# Patient Record
Sex: Female | Born: 1955 | Race: Black or African American | Hispanic: No | Marital: Married | Smoking: Never smoker
Health system: Southern US, Community
[De-identification: ages and names within clinical notes are randomized; demographics above are authoritative.]

## PROBLEM LIST (undated history)

## (undated) DIAGNOSIS — T7840XA Allergy, unspecified, initial encounter: Secondary | ICD-10-CM

## (undated) DIAGNOSIS — M1990 Unspecified osteoarthritis, unspecified site: Secondary | ICD-10-CM

## (undated) DIAGNOSIS — R519 Headache, unspecified: Secondary | ICD-10-CM

## (undated) DIAGNOSIS — D649 Anemia, unspecified: Secondary | ICD-10-CM

## (undated) DIAGNOSIS — H269 Unspecified cataract: Secondary | ICD-10-CM

## (undated) DIAGNOSIS — J45909 Unspecified asthma, uncomplicated: Secondary | ICD-10-CM

## (undated) DIAGNOSIS — R51 Headache: Secondary | ICD-10-CM

## (undated) DIAGNOSIS — Z8669 Personal history of other diseases of the nervous system and sense organs: Secondary | ICD-10-CM

## (undated) DIAGNOSIS — I10 Essential (primary) hypertension: Secondary | ICD-10-CM

## (undated) HISTORY — DX: Unspecified cataract: H26.9

## (undated) HISTORY — PX: BILATERAL CARPAL TUNNEL RELEASE: SHX6508

## (undated) HISTORY — PX: DENTAL SURGERY: SHX609

## (undated) HISTORY — DX: Personal history of other diseases of the nervous system and sense organs: Z86.69

## (undated) HISTORY — DX: Essential (primary) hypertension: I10

## (undated) HISTORY — PX: OTHER SURGICAL HISTORY: SHX169

## (undated) HISTORY — PX: COLONOSCOPY: SHX174

## (undated) HISTORY — PX: TUBAL LIGATION: SHX77

## (undated) HISTORY — DX: Allergy, unspecified, initial encounter: T78.40XA

---

## 1997-09-08 ENCOUNTER — Ambulatory Visit (HOSPITAL_COMMUNITY): Admission: RE | Admit: 1997-09-08 | Discharge: 1997-09-08 | Payer: Self-pay | Admitting: Gynecology

## 2001-09-25 ENCOUNTER — Encounter: Admission: RE | Admit: 2001-09-25 | Discharge: 2001-09-25 | Payer: Self-pay | Admitting: Family Medicine

## 2001-09-25 ENCOUNTER — Encounter: Payer: Self-pay | Admitting: Family Medicine

## 2002-01-28 ENCOUNTER — Other Ambulatory Visit: Admission: RE | Admit: 2002-01-28 | Discharge: 2002-01-28 | Payer: Self-pay | Admitting: Family Medicine

## 2002-12-16 ENCOUNTER — Other Ambulatory Visit: Admission: RE | Admit: 2002-12-16 | Discharge: 2002-12-16 | Payer: Self-pay | Admitting: Family Medicine

## 2003-01-14 ENCOUNTER — Encounter: Payer: Self-pay | Admitting: Family Medicine

## 2003-01-14 ENCOUNTER — Encounter: Admission: RE | Admit: 2003-01-14 | Discharge: 2003-01-14 | Payer: Self-pay | Admitting: Family Medicine

## 2004-01-09 ENCOUNTER — Other Ambulatory Visit: Admission: RE | Admit: 2004-01-09 | Discharge: 2004-01-09 | Payer: Self-pay | Admitting: Family Medicine

## 2004-01-27 ENCOUNTER — Encounter: Admission: RE | Admit: 2004-01-27 | Discharge: 2004-01-27 | Payer: Self-pay | Admitting: Family Medicine

## 2004-02-14 ENCOUNTER — Ambulatory Visit: Payer: Self-pay | Admitting: Family Medicine

## 2004-03-06 ENCOUNTER — Ambulatory Visit: Payer: Self-pay | Admitting: Family Medicine

## 2004-06-29 ENCOUNTER — Ambulatory Visit: Payer: Self-pay | Admitting: Family Medicine

## 2005-01-04 ENCOUNTER — Ambulatory Visit: Payer: Self-pay | Admitting: Family Medicine

## 2005-02-07 ENCOUNTER — Other Ambulatory Visit: Admission: RE | Admit: 2005-02-07 | Discharge: 2005-02-07 | Payer: Self-pay | Admitting: Family Medicine

## 2005-02-07 ENCOUNTER — Ambulatory Visit: Payer: Self-pay | Admitting: Family Medicine

## 2005-02-14 ENCOUNTER — Encounter: Admission: RE | Admit: 2005-02-14 | Discharge: 2005-02-14 | Payer: Self-pay | Admitting: Family Medicine

## 2005-04-05 ENCOUNTER — Ambulatory Visit: Payer: Self-pay | Admitting: Family Medicine

## 2006-02-03 ENCOUNTER — Ambulatory Visit: Payer: Self-pay | Admitting: Family Medicine

## 2006-02-03 LAB — CONVERTED CEMR LAB
ALT: 59 units/L — ABNORMAL HIGH (ref 0–40)
AST: 42 units/L — ABNORMAL HIGH (ref 0–37)
Albumin: 3.6 g/dL (ref 3.5–5.2)
Alkaline Phosphatase: 48 units/L (ref 39–117)
BUN: 18 mg/dL (ref 6–23)
Basophils Absolute: 0 10*3/uL (ref 0.0–0.1)
Basophils Relative: 0.8 % (ref 0.0–1.0)
Bilirubin Urine: NEGATIVE
CO2: 29 meq/L (ref 19–32)
Calcium: 9.1 mg/dL (ref 8.4–10.5)
Chloride: 108 meq/L (ref 96–112)
Chol/HDL Ratio, serum: 3.4
Cholesterol: 172 mg/dL (ref 0–200)
Creatinine, Ser: 0.8 mg/dL (ref 0.4–1.2)
Eosinophil percent: 3.8 % (ref 0.0–5.0)
GFR calc non Af Amer: 81 mL/min
Glomerular Filtration Rate, Af Am: 98 mL/min/{1.73_m2}
Glucose, Bld: 91 mg/dL (ref 70–99)
HCT: 35.2 % — ABNORMAL LOW (ref 36.0–46.0)
HDL: 50.4 mg/dL (ref >39.0)
Hemoglobin: 11.7 g/dL — ABNORMAL LOW (ref 12.0–15.0)
Ketones, ur: NEGATIVE mg/dL
LDL Cholesterol: 109 mg/dL — ABNORMAL HIGH (ref 0–99)
Leukocytes, UA: NEGATIVE
Lymphocytes Relative: 44.7 % (ref 12.0–46.0)
MCHC: 33.4 g/dL (ref 30.0–36.0)
MCV: 85.5 fL (ref 78.0–100.0)
Monocytes Absolute: 0.2 10*3/uL (ref 0.2–0.7)
Monocytes Relative: 6.3 % (ref 3.0–11.0)
Neutro Abs: 1.7 10*3/uL (ref 1.4–7.7)
Neutrophils Relative %: 44.4 % (ref 43.0–77.0)
Nitrite: NEGATIVE
Platelets: 186 10*3/uL (ref 150–400)
Potassium: 3.6 meq/L (ref 3.5–5.1)
RBC: 4.11 M/uL (ref 3.87–5.11)
RDW: 12.8 % (ref 11.5–14.6)
Sodium: 144 meq/L (ref 135–145)
Specific Gravity, Urine: 1.025 (ref 1.000–1.03)
TSH: 3.94 microintl units/mL (ref 0.35–5.50)
Total Bilirubin: 0.5 mg/dL (ref 0.3–1.2)
Total Protein, Urine: NEGATIVE mg/dL
Total Protein: 6.7 g/dL (ref 6.0–8.3)
Triglyceride fasting, serum: 62 mg/dL (ref 0–149)
Urine Glucose: NEGATIVE mg/dL
Urobilinogen, UA: 0.2 (ref 0.0–1.0)
VLDL: 12 mg/dL (ref 0–40)
WBC: 3.8 10*3/uL — ABNORMAL LOW (ref 4.5–10.5)
pH: 6 (ref 5.0–8.0)

## 2006-02-10 ENCOUNTER — Other Ambulatory Visit: Admission: RE | Admit: 2006-02-10 | Discharge: 2006-02-10 | Payer: Self-pay | Admitting: Family Medicine

## 2006-02-10 ENCOUNTER — Encounter: Payer: Self-pay | Admitting: Family Medicine

## 2006-02-10 ENCOUNTER — Ambulatory Visit: Payer: Self-pay | Admitting: Family Medicine

## 2006-02-17 ENCOUNTER — Encounter: Admission: RE | Admit: 2006-02-17 | Discharge: 2006-02-17 | Payer: Self-pay | Admitting: Family Medicine

## 2006-07-17 ENCOUNTER — Ambulatory Visit: Payer: Self-pay | Admitting: Internal Medicine

## 2006-11-04 DIAGNOSIS — I10 Essential (primary) hypertension: Secondary | ICD-10-CM | POA: Insufficient documentation

## 2007-02-04 ENCOUNTER — Ambulatory Visit: Payer: Self-pay | Admitting: Family Medicine

## 2007-02-04 LAB — CONVERTED CEMR LAB
ALT: 18 units/L (ref 0–35)
AST: 22 units/L (ref 0–37)
Albumin: 3.8 g/dL (ref 3.5–5.2)
Alkaline Phosphatase: 69 units/L (ref 39–117)
BUN: 14 mg/dL (ref 6–23)
Basophils Absolute: 0 10*3/uL (ref 0.0–0.1)
Basophils Relative: 0.2 % (ref 0.0–1.0)
Bilirubin Urine: NEGATIVE
Bilirubin, Direct: 0.2 mg/dL (ref 0.0–0.3)
Blood in Urine, dipstick: NEGATIVE
CO2: 30 meq/L (ref 19–32)
Calcium: 9.8 mg/dL (ref 8.4–10.5)
Chloride: 105 meq/L (ref 96–112)
Cholesterol: 184 mg/dL (ref 0–200)
Creatinine, Ser: 0.9 mg/dL (ref 0.4–1.2)
Eosinophils Absolute: 0.1 10*3/uL (ref 0.0–0.6)
Eosinophils Relative: 2.3 % (ref 0.0–5.0)
GFR calc Af Amer: 85 mL/min
GFR calc non Af Amer: 70 mL/min
Glucose, Bld: 88 mg/dL (ref 70–99)
Glucose, Urine, Semiquant: NEGATIVE
HCT: 38.3 % (ref 36.0–46.0)
HDL: 61 mg/dL (ref >39.0)
Hemoglobin: 12.9 g/dL (ref 12.0–15.0)
Ketones, urine, test strip: NEGATIVE
LDL Cholesterol: 108 mg/dL — ABNORMAL HIGH (ref 0–99)
Lymphocytes Relative: 45.4 % (ref 12.0–46.0)
MCHC: 33.6 g/dL (ref 30.0–36.0)
MCV: 86.5 fL (ref 78.0–100.0)
Monocytes Absolute: 0.3 10*3/uL (ref 0.2–0.7)
Monocytes Relative: 8.1 % (ref 3.0–11.0)
Neutro Abs: 1.8 10*3/uL (ref 1.4–7.7)
Neutrophils Relative %: 44 % (ref 43.0–77.0)
Nitrite: NEGATIVE
Platelets: 201 10*3/uL (ref 150–400)
Potassium: 3.5 meq/L (ref 3.5–5.1)
RBC: 4.43 M/uL (ref 3.87–5.11)
RDW: 13.3 % (ref 11.5–14.6)
Sodium: 142 meq/L (ref 135–145)
Specific Gravity, Urine: 1.015
TSH: 4.42 microintl units/mL (ref 0.35–5.50)
Total Bilirubin: 0.8 mg/dL (ref 0.3–1.2)
Total CHOL/HDL Ratio: 3
Total Protein: 7 g/dL (ref 6.0–8.3)
Triglycerides: 76 mg/dL (ref 0–149)
Urobilinogen, UA: 0.2
VLDL: 15 mg/dL (ref 0–40)
WBC Urine, dipstick: NEGATIVE
WBC: 4.2 10*3/uL — ABNORMAL LOW (ref 4.5–10.5)
pH: 7

## 2007-02-11 ENCOUNTER — Other Ambulatory Visit: Admission: RE | Admit: 2007-02-11 | Discharge: 2007-02-11 | Payer: Self-pay | Admitting: Family Medicine

## 2007-02-11 ENCOUNTER — Encounter: Payer: Self-pay | Admitting: Family Medicine

## 2007-02-11 ENCOUNTER — Ambulatory Visit: Payer: Self-pay | Admitting: Family Medicine

## 2007-02-17 ENCOUNTER — Ambulatory Visit: Payer: Self-pay | Admitting: Family Medicine

## 2007-02-17 DIAGNOSIS — D492 Neoplasm of unspecified behavior of bone, soft tissue, and skin: Secondary | ICD-10-CM | POA: Insufficient documentation

## 2007-02-20 ENCOUNTER — Encounter: Admission: RE | Admit: 2007-02-20 | Discharge: 2007-02-20 | Payer: Self-pay | Admitting: Family Medicine

## 2007-02-24 ENCOUNTER — Telehealth: Payer: Self-pay | Admitting: Family Medicine

## 2007-03-14 ENCOUNTER — Telehealth: Payer: Self-pay | Admitting: Family Medicine

## 2007-03-16 ENCOUNTER — Telehealth: Payer: Self-pay | Admitting: Family Medicine

## 2007-04-14 ENCOUNTER — Ambulatory Visit: Payer: Self-pay | Admitting: Family Medicine

## 2007-04-29 ENCOUNTER — Telehealth: Payer: Self-pay | Admitting: Family Medicine

## 2007-06-12 ENCOUNTER — Telehealth: Payer: Self-pay | Admitting: Family Medicine

## 2007-11-06 ENCOUNTER — Ambulatory Visit (HOSPITAL_BASED_OUTPATIENT_CLINIC_OR_DEPARTMENT_OTHER): Admission: RE | Admit: 2007-11-06 | Discharge: 2007-11-06 | Payer: Self-pay | Admitting: Orthopedic Surgery

## 2008-02-02 ENCOUNTER — Ambulatory Visit (HOSPITAL_BASED_OUTPATIENT_CLINIC_OR_DEPARTMENT_OTHER): Admission: RE | Admit: 2008-02-02 | Discharge: 2008-02-02 | Payer: Self-pay | Admitting: Orthopedic Surgery

## 2008-02-11 ENCOUNTER — Ambulatory Visit: Payer: Self-pay | Admitting: Family Medicine

## 2008-02-11 LAB — CONVERTED CEMR LAB
ALT: 21 units/L (ref 0–35)
AST: 24 units/L (ref 0–37)
Albumin: 3.8 g/dL (ref 3.5–5.2)
Alkaline Phosphatase: 61 units/L (ref 39–117)
BUN: 23 mg/dL (ref 6–23)
Basophils Absolute: 0 10*3/uL (ref 0.0–0.1)
Basophils Relative: 0.2 % (ref 0.0–3.0)
Bilirubin Urine: NEGATIVE
Bilirubin, Direct: 0.1 mg/dL (ref 0.0–0.3)
CO2: 29 meq/L (ref 19–32)
Calcium: 9.2 mg/dL (ref 8.4–10.5)
Chloride: 103 meq/L (ref 96–112)
Cholesterol: 190 mg/dL (ref 0–200)
Creatinine, Ser: 1 mg/dL (ref 0.4–1.2)
Eosinophils Absolute: 0.1 10*3/uL (ref 0.0–0.7)
Eosinophils Relative: 2.6 % (ref 0.0–5.0)
GFR calc Af Amer: 75 mL/min
GFR calc non Af Amer: 62 mL/min
Glucose, Bld: 90 mg/dL (ref 70–99)
Glucose, Urine, Semiquant: NEGATIVE
HCT: 36.8 % (ref 36.0–46.0)
HDL: 64 mg/dL (ref >39.0)
Hemoglobin: 12.7 g/dL (ref 12.0–15.0)
Ketones, urine, test strip: NEGATIVE
LDL Cholesterol: 104 mg/dL — ABNORMAL HIGH (ref 0–99)
Lymphocytes Relative: 42.6 % (ref 12.0–46.0)
MCHC: 34.4 g/dL (ref 30.0–36.0)
MCV: 87 fL (ref 78.0–100.0)
Monocytes Absolute: 0.4 10*3/uL (ref 0.1–1.0)
Monocytes Relative: 7.8 % (ref 3.0–12.0)
Neutro Abs: 2.2 10*3/uL (ref 1.4–7.7)
Neutrophils Relative %: 46.8 % (ref 43.0–77.0)
Nitrite: NEGATIVE
Platelets: 168 10*3/uL (ref 150–400)
Potassium: 3.1 meq/L — ABNORMAL LOW (ref 3.5–5.1)
RBC: 4.23 M/uL (ref 3.87–5.11)
RDW: 13.5 % (ref 11.5–14.6)
Sodium: 143 meq/L (ref 135–145)
Specific Gravity, Urine: 1.025
TSH: 5.59 microintl units/mL — ABNORMAL HIGH (ref 0.35–5.50)
Total Bilirubin: 0.6 mg/dL (ref 0.3–1.2)
Total CHOL/HDL Ratio: 3
Total Protein: 7.3 g/dL (ref 6.0–8.3)
Triglycerides: 110 mg/dL (ref 0–149)
Urobilinogen, UA: 0.2
VLDL: 22 mg/dL (ref 0–40)
WBC Urine, dipstick: NEGATIVE
WBC: 4.7 10*3/uL (ref 4.5–10.5)
pH: 5.5

## 2008-02-22 ENCOUNTER — Other Ambulatory Visit: Admission: RE | Admit: 2008-02-22 | Discharge: 2008-02-22 | Payer: Self-pay | Admitting: Family Medicine

## 2008-02-22 ENCOUNTER — Ambulatory Visit: Payer: Self-pay | Admitting: Family Medicine

## 2008-02-22 ENCOUNTER — Encounter: Payer: Self-pay | Admitting: Family Medicine

## 2008-02-22 DIAGNOSIS — R198 Other specified symptoms and signs involving the digestive system and abdomen: Secondary | ICD-10-CM | POA: Insufficient documentation

## 2008-02-22 DIAGNOSIS — I872 Venous insufficiency (chronic) (peripheral): Secondary | ICD-10-CM | POA: Insufficient documentation

## 2008-02-23 ENCOUNTER — Encounter: Admission: RE | Admit: 2008-02-23 | Discharge: 2008-02-23 | Payer: Self-pay | Admitting: Family Medicine

## 2008-03-17 ENCOUNTER — Ambulatory Visit: Payer: Self-pay | Admitting: Gastroenterology

## 2008-03-28 ENCOUNTER — Ambulatory Visit: Payer: Self-pay | Admitting: Gastroenterology

## 2008-03-28 LAB — HM COLONOSCOPY

## 2008-07-20 ENCOUNTER — Ambulatory Visit: Payer: Self-pay | Admitting: Family Medicine

## 2008-07-20 DIAGNOSIS — J309 Allergic rhinitis, unspecified: Secondary | ICD-10-CM | POA: Insufficient documentation

## 2009-02-16 ENCOUNTER — Ambulatory Visit: Payer: Self-pay | Admitting: Family Medicine

## 2009-02-16 LAB — CONVERTED CEMR LAB
ALT: 20 units/L (ref 0–35)
AST: 24 units/L (ref 0–37)
Albumin: 3.9 g/dL (ref 3.5–5.2)
Alkaline Phosphatase: 71 units/L (ref 39–117)
BUN: 13 mg/dL (ref 6–23)
Basophils Absolute: 0 10*3/uL (ref 0.0–0.1)
Basophils Relative: 0.3 % (ref 0.0–3.0)
Bilirubin Urine: NEGATIVE
Bilirubin, Direct: 0.1 mg/dL (ref 0.0–0.3)
CO2: 30 meq/L (ref 19–32)
Calcium: 9.3 mg/dL (ref 8.4–10.5)
Chloride: 105 meq/L (ref 96–112)
Cholesterol: 185 mg/dL (ref 0–200)
Creatinine, Ser: 0.9 mg/dL (ref 0.4–1.2)
Eosinophils Absolute: 0.1 10*3/uL (ref 0.0–0.7)
Eosinophils Relative: 2.8 % (ref 0.0–5.0)
GFR calc non Af Amer: 84.5 mL/min (ref >60)
Glucose, Bld: 89 mg/dL (ref 70–99)
HCT: 39 % (ref 36.0–46.0)
HDL: 66.3 mg/dL (ref >39.00)
Hemoglobin: 13 g/dL (ref 12.0–15.0)
Ketones, ur: NEGATIVE mg/dL
LDL Cholesterol: 105 mg/dL — ABNORMAL HIGH (ref 0–99)
Leukocytes, UA: NEGATIVE
Lymphocytes Relative: 43.6 % (ref 12.0–46.0)
Lymphs Abs: 1.8 10*3/uL (ref 0.7–4.0)
MCHC: 33.3 g/dL (ref 30.0–36.0)
MCV: 86.7 fL (ref 78.0–100.0)
Monocytes Absolute: 0.4 10*3/uL (ref 0.1–1.0)
Monocytes Relative: 9.1 % (ref 3.0–12.0)
Neutro Abs: 1.9 10*3/uL (ref 1.4–7.7)
Neutrophils Relative %: 44.2 % (ref 43.0–77.0)
Nitrite: NEGATIVE
Platelets: 175 10*3/uL (ref 150.0–400.0)
Potassium: 3.3 meq/L — ABNORMAL LOW (ref 3.5–5.1)
RBC: 4.51 M/uL (ref 3.87–5.11)
RDW: 13.1 % (ref 11.5–14.6)
Sodium: 143 meq/L (ref 135–145)
Specific Gravity, Urine: 1.015 (ref 1.000–1.030)
TSH: 5.14 microintl units/mL (ref 0.35–5.50)
Total Bilirubin: 0.8 mg/dL (ref 0.3–1.2)
Total CHOL/HDL Ratio: 3
Total Protein: 7.1 g/dL (ref 6.0–8.3)
Triglycerides: 68 mg/dL (ref 0.0–149.0)
Urine Glucose: NEGATIVE mg/dL
Urobilinogen, UA: 0.2 (ref 0.0–1.0)
VLDL: 13.6 mg/dL (ref 0.0–40.0)
WBC: 4.2 10*3/uL — ABNORMAL LOW (ref 4.5–10.5)
pH: 6 (ref 5.0–8.0)

## 2009-02-23 ENCOUNTER — Encounter: Admission: RE | Admit: 2009-02-23 | Discharge: 2009-02-23 | Payer: Self-pay | Admitting: Family Medicine

## 2009-02-23 ENCOUNTER — Encounter: Payer: Self-pay | Admitting: Family Medicine

## 2009-02-23 ENCOUNTER — Ambulatory Visit: Payer: Self-pay | Admitting: Family Medicine

## 2009-02-23 ENCOUNTER — Other Ambulatory Visit: Admission: RE | Admit: 2009-02-23 | Discharge: 2009-02-23 | Payer: Self-pay | Admitting: Family Medicine

## 2009-02-23 DIAGNOSIS — M722 Plantar fascial fibromatosis: Secondary | ICD-10-CM | POA: Insufficient documentation

## 2009-03-17 ENCOUNTER — Encounter (INDEPENDENT_AMBULATORY_CARE_PROVIDER_SITE_OTHER): Payer: Self-pay | Admitting: *Deleted

## 2009-03-22 ENCOUNTER — Telehealth: Payer: Self-pay | Admitting: Family Medicine

## 2009-03-28 ENCOUNTER — Ambulatory Visit: Payer: Self-pay | Admitting: Family Medicine

## 2009-03-28 DIAGNOSIS — N390 Urinary tract infection, site not specified: Secondary | ICD-10-CM | POA: Insufficient documentation

## 2009-03-28 LAB — CONVERTED CEMR LAB
Bilirubin Urine: NEGATIVE
Glucose, Urine, Semiquant: NEGATIVE
Ketones, urine, test strip: NEGATIVE
Nitrite: NEGATIVE
Protein, U semiquant: NEGATIVE
Specific Gravity, Urine: 1.01
Urobilinogen, UA: 0.2
pH: 5

## 2010-01-11 ENCOUNTER — Ambulatory Visit: Payer: Self-pay | Admitting: Family Medicine

## 2010-02-13 ENCOUNTER — Ambulatory Visit: Payer: Self-pay | Admitting: Family Medicine

## 2010-02-13 LAB — CONVERTED CEMR LAB
ALT: 21 units/L (ref 0–35)
AST: 24 units/L (ref 0–37)
Albumin: 4 g/dL (ref 3.5–5.2)
Alkaline Phosphatase: 63 units/L (ref 39–117)
BUN: 15 mg/dL (ref 6–23)
Basophils Absolute: 0 10*3/uL (ref 0.0–0.1)
Basophils Relative: 0.4 % (ref 0.0–3.0)
Bilirubin Urine: NEGATIVE
Bilirubin, Direct: 0.1 mg/dL (ref 0.0–0.3)
CO2: 28 meq/L (ref 19–32)
Calcium: 9.5 mg/dL (ref 8.4–10.5)
Chloride: 106 meq/L (ref 96–112)
Cholesterol: 186 mg/dL (ref 0–200)
Creatinine, Ser: 0.8 mg/dL (ref 0.4–1.2)
Eosinophils Absolute: 0.1 10*3/uL (ref 0.0–0.7)
Eosinophils Relative: 1.7 % (ref 0.0–5.0)
GFR calc non Af Amer: 97.84 mL/min (ref >60)
Glucose, Bld: 88 mg/dL (ref 70–99)
HCT: 39.3 % (ref 36.0–46.0)
HDL: 75.8 mg/dL (ref >39.00)
Hemoglobin: 13.2 g/dL (ref 12.0–15.0)
Ketones, ur: NEGATIVE mg/dL
LDL Cholesterol: 97 mg/dL (ref 0–99)
Leukocytes, UA: NEGATIVE
Lymphocytes Relative: 40.4 % (ref 12.0–46.0)
Lymphs Abs: 1.6 10*3/uL (ref 0.7–4.0)
MCHC: 33.6 g/dL (ref 30.0–36.0)
MCV: 85 fL (ref 78.0–100.0)
Monocytes Absolute: 0.3 10*3/uL (ref 0.1–1.0)
Monocytes Relative: 7.8 % (ref 3.0–12.0)
Neutro Abs: 2 10*3/uL (ref 1.4–7.7)
Neutrophils Relative %: 49.7 % (ref 43.0–77.0)
Nitrite: NEGATIVE
Platelets: 179 10*3/uL (ref 150.0–400.0)
Potassium: 4.4 meq/L (ref 3.5–5.1)
RBC: 4.62 M/uL (ref 3.87–5.11)
RDW: 14.6 % (ref 11.5–14.6)
Sodium: 141 meq/L (ref 135–145)
Specific Gravity, Urine: >=1.03 (ref 1.000–1.030)
TSH: 3.09 microintl units/mL (ref 0.35–5.50)
Total Bilirubin: 1 mg/dL (ref 0.3–1.2)
Total CHOL/HDL Ratio: 2
Total Protein, Urine: 30 mg/dL
Total Protein: 7 g/dL (ref 6.0–8.3)
Triglycerides: 67 mg/dL (ref 0.0–149.0)
Urine Glucose: NEGATIVE mg/dL
Urobilinogen, UA: 0.2 (ref 0.0–1.0)
VLDL: 13.4 mg/dL (ref 0.0–40.0)
WBC: 4 10*3/uL — ABNORMAL LOW (ref 4.5–10.5)
pH: 6 (ref 5.0–8.0)

## 2010-02-26 ENCOUNTER — Ambulatory Visit: Payer: Self-pay | Admitting: Family Medicine

## 2010-02-26 ENCOUNTER — Encounter: Payer: Self-pay | Admitting: Family Medicine

## 2010-02-26 ENCOUNTER — Other Ambulatory Visit: Admission: RE | Admit: 2010-02-26 | Discharge: 2010-02-26 | Payer: Self-pay | Admitting: Family Medicine

## 2010-02-26 LAB — CONVERTED CEMR LAB: Pap Smear: NEGATIVE

## 2010-02-26 LAB — HM PAP SMEAR

## 2010-03-02 ENCOUNTER — Encounter: Admission: RE | Admit: 2010-03-02 | Discharge: 2010-03-02 | Payer: Self-pay | Admitting: Family Medicine

## 2010-03-02 LAB — HM MAMMOGRAPHY

## 2010-05-05 ENCOUNTER — Encounter: Payer: Self-pay | Admitting: Family Medicine

## 2010-05-15 NOTE — Progress Notes (Signed)
Summary: return call to dr Nikolina Simerson  Phone Note Call from Patient Call back at Home Phone [phone]   Caller: Patient Call For: dr Chivas Notz Reason for Call: Talk to Doctor Summary of Call: pt is returning dr Devone Tousley call Initial call taken by: Heron Sabins,  February 24, 2007 8:13 AM  Follow-up for Phone Call        call patient left message to return for re-excision of the lesion on her left knee since margins were involved and it was somewhat of a dysplastic type nevus Follow-up by: Roderick Pee MD,  February 26, 2007 8:14 AM

## 2010-05-15 NOTE — Letter (Signed)
Summary: LETTER OF MEDICAL NECESSITY  LETTER OF MEDICAL NECESSITY Corene Resnick: This Rosaleigh Brazzel was preselected by the workflow.  Signature: The signature status of this document was preset by the workflow  Processed by [employer] Local Indexer Client @ Monday, March 27, 2009 11:06:15 AM using version:2010.1.2.11(2.4)   Manually Indexed By: [id number]  idlBatchDetail: [id number]   _____________________________________________________________________  External Attachment:    Type:   Image     Comment:   External Document

## 2010-05-15 NOTE — Assessment & Plan Note (Signed)
Summary: CPX // RS   Vital Signs:  Patient profile:   55 year old female Menstrual status:  postmenopausal Height:      63 inches Weight:      206 pounds BMI:     36.62 Temp:     98.0 degrees F oral BP sitting:   120 / 84  (left arm) Cuff size:   regular  Vitals Entered By: Katie Richards CMA [name] (February 26, 2010 8:22 AM) CC: cpx Is Patient Diabetic? No   CC:  cpx.  History of Present Illness: Katie Richards is a 55 year old, married female, nonsmoker, who comes in today for general physical examination because of a history of hypertension, postmenopausal vaginal dryness, venous insufficiency.  She takes Tenoretic 50 to 25 does one half tab daily for hypertension along with Lasix 20 daily and 40 mEq of potassium daily.  She also bruises a lot with her aspirin 81 mg daily, advised to take it every other day.  She also takes Premarin vaginal cream twice monthly for vaginal dryness.  Her legs continue to bother her.  It's more of a cosmetic problem.  The pain problem.  Advised weight loss would help a lot.  Also referred to Dr. Hart Rochester for vascular consult.  She gets routine eye care, dental care, BSE monthly, annual mammography, colonoscopy, normal, tetanus, 2004, seasonal flu.  2011.  Allergies: 1)  ! Sulfa  Past History:  Past medical, surgical, family and social histories (including risk factors) reviewed, and no changes noted (except as noted below).  Past Medical History: Reviewed history from 07/20/2008 and no changes required. Hypertension DUB childbirth x 2 hysterectomy for uterine fibroid reverse tubal ligation migraine headaches tennismis Allergic rhinitis  Past Surgical History: Reviewed history from 11/04/2006 and no changes required. CB x 2  Family History: Reviewed history from 11/04/2006 and no changes required. Family History of Alcoholism/Addiction Family History Hypertension Family History Lung cancer Family History of Stroke F 1st degree relative  <60  Social History: Reviewed history from 02/11/2007 and no changes required. Married Never Smoked Alcohol use-yes Drug use-no Regular exercise-no  Review of Systems      See HPI  Physical Exam  General:  Well-developed,well-nourished,in no acute distress; alert,appropriate and cooperative throughout examination Head:  Normocephalic and atraumatic without obvious abnormalities. No apparent alopecia or balding. Eyes:  No corneal or conjunctival inflammation noted. EOMI. Perrla. Funduscopic exam benign, without hemorrhages, exudates or papilledema. Vision grossly normal. Ears:  External ear exam shows no significant lesions or deformities.  Otoscopic examination reveals clear canals, tympanic membranes are intact bilaterally without bulging, retraction, inflammation or discharge. Hearing is grossly normal bilaterally. Nose:  External nasal examination shows no deformity or inflammation. Nasal mucosa are pink and moist without lesions or exudates. Mouth:  Oral mucosa and oropharynx without lesions or exudates.  Teeth in good repair. Neck:  No deformities, masses, or tenderness noted. Chest Wall:  No deformities, masses, or tenderness noted. Breasts:  No mass, nodules, thickening, tenderness, bulging, retraction, inflamation, nipple discharge or skin changes noted.   Lungs:  Normal respiratory effort, chest expands symmetrically. Lungs are clear to auscultation, no crackles or wheezes. Heart:  Normal rate and regular rhythm. S1 and S2 normal without gallop, murmur, click, rub or other extra sounds. Abdomen:  Bowel sounds positive,abdomen soft and non-tender without masses, organomegaly or hernias noted. Rectal:  No external abnormalities noted. Normal sphincter tone. No rectal masses or tenderness. Genitalia:  Pelvic Exam:        External: normal female  genitalia without lesions or masses        Vagina: normal without lesions or masses        Cervix: normal without lesions or masses         Adnexa: normal bimanual exam without masses or fullness        Uterus: normal by palpation        Pap smear: performed Msk:  No deformity or scoliosis noted of thoracic or lumbar spine.   Pulses:  R and L carotid,radial,femoral,dorsalis pedis and posterior tibial pulses are full and equal bilaterally Extremities:  No clubbing, cyanosis, edema, or deformity noted with normal full range of motion of all joints.   Neurologic:  No cranial nerve deficits noted. Station and gait are normal. Plantar reflexes are down-going bilaterally. DTRs are symmetrical throughout. Sensory, motor and coordinative functions appear intact. Skin:  Intact without suspicious lesions or rashes Cervical Nodes:  No lymphadenopathy noted Axillary Nodes:  No palpable lymphadenopathy Inguinal Nodes:  No significant adenopathy Psych:  Cognition and judgment appear intact. Alert and cooperative with normal attention span and concentration. No apparent delusions, illusions, hallucinations   Impression & Recommendations:  Problem # 1:  VENOUS INSUFFICIENCY (ICD-459.81) Assessment Unchanged  Orders: Prescription Created Electronically [id number])  Problem # 2:  PHYSICAL EXAMINATION (ICD-V70.0) Assessment: Unchanged  Orders: Prescription Created Electronically [phone])  Problem # 3:  HYPERTENSION (ICD-401.9) Assessment: Improved  Her updated medication list for this problem includes:    Tenoretic 50 50-25 Mg Tabs (Atenolol-chlorthalidone) .Marland Kitchen... 1/2 qam    Lasix 20 Mg Tabs (Furosemide) .Marland Kitchen... 1 tab qam  Orders: Prescription Created Electronically [phone])  Complete Medication List: 1)  Tenoretic 50 50-25 Mg Tabs (Atenolol-chlorthalidone) .... 1/2 qam 2)  Adult Aspirin Low Strength 81 Mg Tbdp (Aspirin) 3)  Lasix 20 Mg Tabs (Furosemide) .Marland Kitchen.. 1 tab qam 4)  Klor-con M20 20 Meq Tbcr (Potassium chloride crys cr) .... 2am 5)  Ca+ Vit D  6)  Premarin 0.625 Mg/gm Crea (Estrogens, conjugated) .... Apply 2 x week  Patient  Instructions: 1)  Please schedule a follow-up appointment in 1 year. 2)  It is important that you exercise regularly at least 20 minutes 5 times a week. If you develop chest pain, have severe difficulty breathing, or feel very tired , stop exercising immediately and seek medical attention. 3)  You need to lose weight. Consider a lower calorie diet and regular exercise.  4)  Schedule your mammogram. 5)  Take calcium +Vitamin D daily. 6)  Take an Aspirin Monday Wednesday, Friday. 7)  Dr. Betti Cruz is the vascular specialist.  I would recommend if you would like to consult Prescriptions: PREMARIN 0.625 MG/GM CREA (ESTROGENS, CONJUGATED) apply 2 x week  #3 tubes x 4   Entered and Authorized by:   Roderick Pee MD   Signed by:   Roderick Pee MD on 02/26/2010   Method used:   Electronically to        [facility]* (retail)       [address] [place], [place]  [address]       Ph: [phone]       Fax: [phone]   RxID:   [id number] LASIX 20 MG  TABS (FUROSEMIDE) 1 TAB QAM  #100 x 4   Entered and Authorized by:   Roderick Pee MD   Signed by:   Roderick Pee MD on 02/26/2010  Method used:   Electronically to        [facility]* (retail)       [address] [place], [place]  [address]       Ph: [phone]       Fax: [phone]   RxID:   [id number] TENORETIC 50 50-25 MG  TABS (ATENOLOL-CHLORTHALIDONE) 1/2 QAM  #50 x 4   Entered and Authorized by:   Roderick Pee MD   Signed by:   Roderick Pee MD on 02/26/2010   Method used:   Electronically to        [facility]* (retail)       [address] [place], [place]  [address]       Ph: [phone]       Fax: [phone]   RxID:   [id number] KLOR-CON M20 20 MEQ  TBCR (POTASSIUM CHLORIDE CRYS CR) 2AM  #200 x 3   Entered and Authorized by:   Roderick Pee MD   Signed by:   Roderick Pee MD on 02/26/2010    Method used:   Electronically to        [facility]* (retail)       [address] [place], [place]  [address]       Ph: [phone]       Fax: [phone]   RxID:   [id number]    Orders Added: 1)  Prescription Created Electronically [[id number]] 2)  Est. Patient 40-64 years [23536]     Appended Document: Orders Update    Clinical Lists Changes  Orders: Added new Service order of EKG w/ Interpretation (93000) - Signed

## 2010-05-15 NOTE — Assessment & Plan Note (Signed)
Summary: mole removal per doc/nta    Procedure Note Last Tetanus: Historical (04/15/2002)  Mole Biopsy/Removal: Indication: suspicious lesion  Procedure # 1: elliptical incision with 3 mm margin    Size (in cm): 1.0 x 1.0    Region: dorsal    Location: right foot    Instrument used: #15 blade    Anesthesia: 1% lidocaine w/epinephrine  Procedure # 2: elliptical incision with 3 mm margin    Size (in cm): 0.6 x 0.6    Region: anterior    Location: left lower leg    Instrument used: #15 blade    Anesthesia: 1% lidocaine w/epinephrine   Chief Complaint:  mole of right foot and left knee to be removed because of increasing pigmentation.  Current Allergies: ! SULFA        Complete Medication List: 1)  Tenoretic 50 50-25 Mg Tabs (Atenolol-chlorthalidone) .... 1/2 qam 2)  Adult Aspirin Low Strength 81 Mg Tbdp (Aspirin) 3)  Lasix 20 Mg Tabs (Furosemide) .Marland Kitchen.. 1 tab qam 4)  Klor-con M20 20 Meq Tbcr (Potassium chloride crys cr) .... 2am 2 pm 5)  Ca+ Vit D       ]  Appended Document: Orders Update    Clinical Lists Changes  Problems: Added new problem of NEOPLASM, SKIN (ICD-239.2) Orders: Added new Service order of Shave Skin Lesion 0.6-1.0 cm/trunk/arm/leg (44010) - Signed

## 2010-05-15 NOTE — Assessment & Plan Note (Signed)
Summary: ?UTI/NJR 12NOON/NJR   Vital Signs:  Patient profile:   55 year old female Menstrual status:  postmenopausal Weight:      209 pounds Temp:     98.5 degrees F oral BP sitting:   124 / 84  (left arm)  Vitals Entered By: Kern Reap CMA [name] (March 28, 2009 12:17 PM)  Reason for Visit uti  History of Present Illness: Katie Richards is a 55 year old, married female, nonsmoker LMP 4 years ago, who comes in today for evaluation of 5 days, history of dysuria and frequency of urination.  She denies fever, chills, and back pain.  Last year and a tract infection was many years ago.  She is using FDS vaginal sprain.  Advised to stop using that.  She has no fever, chills, vomiting, etc.  Allergies: 1)  ! Sulfa  Past History:  Past medical, surgical, family and social histories (including risk factors) reviewed for relevance to current acute and chronic problems.  Past Medical History: Reviewed history from 07/20/2008 and no changes required. Hypertension DUB childbirth x 2 hysterectomy for uterine fibroid reverse tubal ligation migraine headaches tennismis Allergic rhinitis  Past Surgical History: Reviewed history from 11/04/2006 and no changes required. CB x 2  Family History: Reviewed history from 11/04/2006 and no changes required. Family History of Alcoholism/Addiction Family History Hypertension Family History Lung cancer Family History of Stroke F 1st degree relative <60  Social History: Reviewed history from 02/11/2007 and no changes required. Married Never Smoked Alcohol use-yes Drug use-no Regular exercise-no  Review of Systems      See HPI  Physical Exam  General:  Well-developed,well-nourished,in no acute distress; alert,appropriate and cooperative throughout examination Abdomen:  Bowel sounds positive,abdomen soft and non-tender without masses, organomegaly or hernias noted.   Problems:  Medical Problems Added: 1)  Dx of Uti   (ICD-599.0)  Impression & Recommendations:  Problem # 1:  UTI (ICD-599.0) Assessment New  Orders: Prescription Created Electronically [phone])  Her updated medication list for this problem includes:    Amoxicillin 500 Mg Caps (Amoxicillin) .Marland Kitchen... Take 1 tablet by mouth three times a day  Complete Medication List: 1)  Tenoretic 50 50-25 Mg Tabs (Atenolol-chlorthalidone) .... 1/2 qam 2)  Adult Aspirin Low Strength 81 Mg Tbdp (Aspirin) 3)  Lasix 20 Mg Tabs (Furosemide) .Marland Kitchen.. 1 tab qam 4)  Klor-con M20 20 Meq Tbcr (Potassium chloride crys cr) .... 2am 2 pm 5)  Ca+ Vit D  6)  Nabumetone 750 Mg Tabs (Nabumetone) .... One tab two times a day 7)  Amoxicillin 500 Mg Caps (Amoxicillin) .... Take 1 tablet by mouth three times a day 8)  Diflucan 100 Mg Tabs (Fluconazole) .... Uad 9)  Premarin 0.625 Mg/gm Crea (Estrogens, conjugated) .... Apply 2 x week  Other Orders: EKG w/ Interpretation (93000)   Patient Instructions: 1)  drink 30 ounces of water daily.  Begin amoxicillin, 500 mg 3 times a day x 10 days.  Return p.r.n. Prescriptions: PREMARIN 0.625 MG/GM CREA (ESTROGENS, CONJUGATED) apply 2 x week  #3 tubes x 4   Entered and Authorized by:   Roderick Pee MD   Signed by:   Roderick Pee MD on 03/28/2009   Method used:   Electronically to        [facility]* (retail)       [address] [place], [place]  [address]       Ph:  [phone]       Fax: [phone]   RxID:   [id number] DIFLUCAN 100 MG TABS (FLUCONAZOLE) UAD  #2 x 2   Entered and Authorized by:   Roderick Pee MD   Signed by:   Roderick Pee MD on 03/28/2009   Method used:   Print then Give to Patient   RxID:   [id number] AMOXICILLIN 500 MG CAPS (AMOXICILLIN) Take 1 tablet by mouth three times a day  #30 x 1   Entered and Authorized by:   Roderick Pee MD   Signed by:   Roderick Pee MD on 03/28/2009   Method used:   Electronically to        [facility]*  (retail)       [address] [place], [place]  [address]       Ph: [phone]       Fax: [phone]   RxID:   [id number]   Laboratory Results   Urine Tests  Date/Time Received: March 28, 2009   Routine Urinalysis   Color: yellow Appearance: Clear Glucose: negative   (Normal Range: Negative) Bilirubin: negative   (Normal Range: Negative) Ketone: negative   (Normal Range: Negative) Spec. Gravity: 1.010   (Normal Range: 1.003-1.035) Blood: trace-lysed   (Normal Range: Negative) pH: 5.0   (Normal Range: 5.0-8.0) Protein: negative   (Normal Range: Negative) Urobilinogen: 0.2   (Normal Range: 0-1) Nitrite: negative   (Normal Range: Negative) Leukocyte Esterace: small   (Normal Range: Negative)    Comments: Kern Reap CMA (AAMA)  March 28, 2009 12:27 PM

## 2010-05-15 NOTE — Assessment & Plan Note (Signed)
Summary: CPX W/PAP/CCM   Vital Signs:  Patient Profile:   55 Years Old Female Height:     63.75 inches (161.93 cm) Weight:      206 pounds Temp:     98.3 degrees F oral Pulse rate:   64 / minute Pulse rhythm:   regular BP sitting:   120 / 80  (left arm) Cuff size:   large  Vitals Entered By: Kern Reap CMA (February 22, 2008 8:39 AM)                 Chief Complaint:   cpx.  History of Present Illness: Katie Richards is a 55 year old female, who comes in today for physical evaluation because of underlying hypertension and venous insufficiency.  Her blood pressures were maintained by Tenoretic 50 -- 25 one half tablet daily.  120/80 today.  Her peripheral edema is managed by Lasix 20 mg daily.  She also takes 40 mEq of potassium b.i.d.  She's cut it down to 3 tablets a day, and, indeed, her potassium is dropped to 3.1.  We tried other medications, and I just don't work, as well as Lasix.  She also has a keloid on her right nipple from her previous lesion.  That was removed by her dermatologist.  She would like to steroid cream for that.  There is also a family history of diverticulosis and her family.  No colon cancer.  No polyps.  She's had 3 episodes in the past 4 months with severe rectal pain.  No bleeding.  The colonoscopy will be set up.  Last tetanus 2004.  I recommend a flu and the H1 N1, vaccine    Prior Medication List:  TENORETIC 50 50-25 MG  TABS (ATENOLOL-CHLORTHALIDONE) 1/2 QAM ADULT ASPIRIN LOW STRENGTH 81 MG  TBDP (ASPIRIN)  LASIX 20 MG  TABS (FUROSEMIDE) 1 TAB QAM KLOR-CON M20 20 MEQ  TBCR (POTASSIUM CHLORIDE CRYS CR) 2AM 2 PM * CA+ VIT D    Current Allergies (reviewed today): ! SULFA  Past Medical History:    Reviewed history from 02/11/2007 and no changes required:       Hypertension       DUB       childbirth x 2       hysterectomy for uterine fibroid       reverse tubal ligation       migraine headaches       tennismis   Family History:  Reviewed history from 11/04/2006 and no changes required:       Family History of Alcoholism/Addiction       Family History Hypertension       Family History Lung cancer       Family History of Stroke F 1st degree relative <60  Social History:    Reviewed history from 02/11/2007 and no changes required:       Married       Never Smoked       Alcohol use-yes       Drug use-no       Regular exercise-no    Review of Systems      See HPI   Physical Exam  General:     Well-developed,well-nourished,in no acute distress; alert,appropriate and cooperative throughout examination Head:     Normocephalic and atraumatic without obvious abnormalities. No apparent alopecia or balding. Eyes:     No corneal or conjunctival inflammation noted. EOMI. Perrla. Funduscopic exam benign, without hemorrhages, exudates or papilledema. Vision grossly normal. Ears:  External ear exam shows no significant lesions or deformities.  Otoscopic examination reveals clear canals, tympanic membranes are intact bilaterally without bulging, retraction, inflammation or discharge. Hearing is grossly normal bilaterally. Nose:     External nasal examination shows no deformity or inflammation. Nasal mucosa are pink and moist without lesions or exudates. Mouth:     Oral mucosa and oropharynx without lesions or exudates.  Teeth in good repair. Neck:     No deformities, masses, or tenderness noted. Chest Wall:     No deformities, masses, or tenderness noted. Breasts:     No mass, nodules, thickening, tenderness, bulging, retraction, inflamation, nipple discharge or skin changes noted.   Lungs:     Normal respiratory effort, chest expands symmetrically. Lungs are clear to auscultation, no crackles or wheezes. Heart:     Normal rate and regular rhythm. S1 and S2 normal without gallop, murmur, click, rub or other extra sounds. Abdomen:     Bowel sounds positive,abdomen soft and non-tender without masses, organomegaly  or hernias noted. Rectal:     No external abnormalities noted. Normal sphincter tone. No rectal masses or tenderness. Genitalia:     Pelvic Exam:        External: normal female genitalia without lesions or masses        Vagina: normal without lesions or masses        Cervix: normal without lesions or masses        Adnexa: normal bimanual exam without masses or fullness        Uterus: normal by palpation        Pap smear: performed Msk:     No deformity or scoliosis noted of thoracic or lumbar spine.   Pulses:     R and L carotid,radial,femoral,dorsalis pedis and posterior tibial pulses are full and equal bilaterally Extremities:     No clubbing, cyanosis, edema, or deformity noted with normal full range of motion of all joints.   Neurologic:     No cranial nerve deficits noted. Station and gait are normal. Plantar reflexes are down-going bilaterally. DTRs are symmetrical throughout. Sensory, motor and coordinative functions appear intact. Skin:     total skin exam normal except for keloid 3 o'clock on her right nipple Cervical Nodes:     No lymphadenopathy noted Axillary Nodes:     No palpable lymphadenopathy Inguinal Nodes:     No significant adenopathy Psych:     Cognition and judgment appear intact. Alert and cooperative with normal attention span and concentration. No apparent delusions, illusions, hallucinations    Impression & Recommendations:  Problem # 1:  HYPERTENSION (ICD-401.9) Assessment: Improved  Her updated medication list for this problem includes:    Tenoretic 50 50-25 Mg Tabs (Atenolol-chlorthalidone) .Marland Kitchen... 1/2 qam    Lasix 20 Mg Tabs (Furosemide) .Marland Kitchen... 1 tab qam   Problem # 2:  VENOUS INSUFFICIENCY (ICD-459.81) Assessment: Unchanged  Problem # 3:  TENESMUS (ICD-787.99) Assessment: New  Orders: Gastroenterology Referral (GI)   Complete Medication List: 1)  Tenoretic 50 50-25 Mg Tabs (Atenolol-chlorthalidone) .... 1/2 qam 2)  Adult Aspirin Low  Strength 81 Mg Tbdp (Aspirin) 3)  Lasix 20 Mg Tabs (Furosemide) .Marland Kitchen.. 1 tab qam 4)  Klor-con M20 20 Meq Tbcr (Potassium chloride crys cr) .... 2am 2 pm 5)  Ca+ Vit D    Patient Instructions: 1)  Please schedule a follow-up appointment in 1 year. 2)  It is important that you exercise regularly at least 20 minutes 5 times a week.  If you develop chest pain, have severe difficulty breathing, or feel very tired , stop exercising immediately and seek medical attention. 3)  Schedule your mammogram. 4)  Schedule a colonoscopy/sigmoidoscopy to help detect colon cancer. 5)  Take calcium +Vitamin D daily. 6)  Take an Aspirin every day.   Prescriptions: KLOR-CON M20 20 MEQ  TBCR (POTASSIUM CHLORIDE CRYS CR) 2AM 2 PM  #400 x 4   Entered and Authorized by:   Roderick Pee MD   Signed by:   Roderick Pee MD on 02/22/2008   Method used:   Electronically to        [facility] [address] [phone]* (retail)       [address]       [place], [place]  [address]       Ph: [phone] or [phone]       Fax: [phone]   RxID:   [id number] LASIX 20 MG  TABS (FUROSEMIDE) 1 TAB QAM  #100 x 4   Entered and Authorized by:   Roderick Pee MD   Signed by:   Roderick Pee MD on 02/22/2008   Method used:   [facility] [phone]* (retail)       [address]       [place], [place]  [address]       Ph: [phone] or [phone]       Fax: [phone]   RxID:   [id number] TENORETIC 50 50-25 MG  TABS (ATENOLOL-CHLORTHALIDONE) 1/2 QAM  #50 x 4   Entered and Authorized by:   Roderick Pee MD   Signed by:   Roderick Pee MD on 02/22/2008   Method used:   [facility] [address] [phone]* (retail)       [address]       [place], [place]  [address]       Ph: [phone] or [phone]       Fax: [phone]   RxID:    [id number]  ]

## 2010-05-15 NOTE — Miscellaneous (Signed)
Summary: [facility] Previsit/prep  Clinical Lists Changes  Medications: Added new medication of MOVIPREP 100 GM  SOLR (PEG-KCL-NACL-NASULF-NA ASC-C) As per prep instructions. - Signed Rx of MOVIPREP 100 GM  SOLR (PEG-KCL-NACL-NASULF-NA ASC-C) As per prep instructions.;  #1 x 0;  Signed;  Entered by: Wyona Almas RN;  Authorized by: Rachael Fee MD;  Method used: [facility] [address] [phone]*, [address] [place], [place], [place]  [address], Ph: [phone] or [phone], Fax: [phone]    Prescriptions: MOVIPREP 100 GM  SOLR (PEG-KCL-NACL-NASULF-NA ASC-C) As per prep instructions.  #1 x 0   Entered by:   Wyona Almas RN   Authorized by:   Rachael Fee MD   Signed by:   Wyona Almas RN on 03/17/2008   Method used:   [facility]        [facility] [address] [phone]* (retail)       [address]       [place], [place]  [address]       Ph: [phone] or [phone]       Fax: [phone]   RxID:   [id number]

## 2010-05-15 NOTE — Assessment & Plan Note (Signed)
Summary: cpx-pap//ccm   Vital Signs:  Patient profile:   55 year old female Menstrual status:  postmenopausal Height:      63 inches Weight:      209 pounds Temp:     98.7 degrees F oral BP sitting:   118 / 80  (left arm) Cuff size:   regular  Vitals Entered By: Kern Reap CMA [name] (February 23, 2009 2:39 PM)  Reason for Visit cpx  History of Present Illness:   Katie Richards  is a 55 year old, married female, nonsmoker, who works at [employer] who comes in today for physical valuation because of underlying hypertension.  She takes Tenoretic 50 -- 25 one half tablet q.a.m. and Lasix 20 q.a.m. and 4, potassium tablets daily.  BP 118/80.  Serum potassium low at 3.3, but she's only been taken 3, potassium tablets a day.  Advised her to go back to 4 tablets daily.  She gets routine eye care, dental care, does BSE monthly, annual mammography, colonoscopy 2010, normal.  Tetanus booster 2004 seasonal flu shot today.  A new problem list plantar fasciitis.  [facility]'s [facility].  Has tried numerous treatment options, none have worked  Allergies: 1)  ! Sulfa  Past History:  Past medical, surgical, family and social histories (including risk factors) reviewed, and no changes noted (except as noted below).  Past Medical History: Reviewed history from 07/20/2008 and no changes required. Hypertension DUB childbirth x 2 hysterectomy for uterine fibroid reverse tubal ligation migraine headaches tennismis Allergic rhinitis  Past Surgical History: Reviewed history from 11/04/2006 and no changes required. CB x 2  Family History: Reviewed history from 11/04/2006 and no changes required. Family History of Alcoholism/Addiction Family History Hypertension Family History Lung cancer Family History of Stroke F 1st degree relative <60  Social History: Reviewed history from 02/11/2007 and no changes required. Married Never Smoked Alcohol use-yes Drug use-no Regular  exercise-no  Review of Systems      See HPI  Physical Exam  General:  Well-developed,well-nourished,in no acute distress; alert,appropriate and cooperative throughout examination Head:  Normocephalic and atraumatic without obvious abnormalities. No apparent alopecia or balding. Eyes:  No corneal or conjunctival inflammation noted. EOMI. Perrla. Funduscopic exam benign, without hemorrhages, exudates or papilledema. Vision grossly normal. Ears:  External ear exam shows no significant lesions or deformities.  Otoscopic examination reveals clear canals, tympanic membranes are intact bilaterally without bulging, retraction, inflammation or discharge. Hearing is grossly normal bilaterally. Nose:  External nasal examination shows no deformity or inflammation. Nasal mucosa are pink and moist without lesions or exudates. Mouth:  Oral mucosa and oropharynx without lesions or exudates.  Teeth in good repair. Neck:  No deformities, masses, or tenderness noted. Chest Wall:  No deformities, masses, or tenderness noted. Breasts:  No mass, nodules, thickening, tenderness, bulging, retraction, inflamation, nipple discharge or skin changes noted.   Lungs:  Normal respiratory effort, chest expands symmetrically. Lungs are clear to auscultation, no crackles or wheezes. Heart:  Normal rate and regular rhythm. S1 and S2 normal without gallop, murmur, click, rub or other extra sounds. Abdomen:  Bowel sounds positive,abdomen soft and non-tender without masses, organomegaly or hernias noted. Genitalia:  Pelvic Exam:        External: normal female genitalia without lesions or masses        Vagina: normal without lesions or masses        Cervix: normal without lesions or masses        Adnexa: normal bimanual exam without  masses or fullness        Uterus: normal by palpation        Pap smear: performed Msk:  No deformity or scoliosis noted of thoracic or lumbar spine.   Pulses:  R and L  carotid,radial,femoral,dorsalis pedis and posterior tibial pulses are full and equal bilaterally Extremities:  No clubbing, cyanosis, edema, or deformity noted with normal full range of motion of all joints.   Neurologic:  No cranial nerve deficits noted. Station and gait are normal. Plantar reflexes are down-going bilaterally. DTRs are symmetrical throughout. Sensory, motor and coordinative functions appear intact. Skin:  Intact without suspicious lesions or rashes Cervical Nodes:  No lymphadenopathy noted Axillary Nodes:  No palpable lymphadenopathy Inguinal Nodes:  No significant adenopathy Psych:  Cognition and judgment appear intact. Alert and cooperative with normal attention span and concentration. No apparent delusions, illusions, hallucinations   Impression & Recommendations:  Problem # 1:  VENOUS INSUFFICIENCY (ICD-459.81) Assessment Improved  Orders: Prescription Created Electronically [id number])  Problem # 2:  NEOPLASM, SKIN (ICD-239.2) Assessment: Unchanged  Problem # 3:  HYPERTENSION (ICD-401.9) Assessment: Improved  Her updated medication list for this problem includes:    Tenoretic 50 50-25 Mg Tabs (Atenolol-chlorthalidone) .Marland Kitchen... 1/2 qam    Lasix 20 Mg Tabs (Furosemide) .Marland Kitchen... 1 tab qam  Orders: Prescription Created Electronically [phone]) EKG w/ Interpretation (93000)  Problem # 4:  PLANTAR FASCIITIS, BILATERAL (ICD-728.71) Assessment: New  Her updated medication list for this problem includes:    Nabumetone 750 Mg Tabs (Nabumetone) ..... One tab two times a day  Orders: Prescription Created Electronically [phone]) Physical Therapy Referral (PT)  Complete Medication List: 1)  Tenoretic 50 50-25 Mg Tabs (Atenolol-chlorthalidone) .... 1/2 qam 2)  Adult Aspirin Low Strength 81 Mg Tbdp (Aspirin) 3)  Lasix 20 Mg Tabs (Furosemide) .Marland Kitchen.. 1 tab qam 4)  Klor-con M20 20 Meq Tbcr (Potassium chloride crys cr) .... 2am 2 pm 5)  Ca+ Vit D  6)  Nabumetone 750 Mg Tabs  (Nabumetone) .... One tab two times a day  Patient Instructions: 1)  Please schedule a follow-up appointment in 1 year. 2)  It is important that you exercise regularly at least 20 minutes 5 times a week. If you develop chest pain, have severe difficulty breathing, or feel very tired , stop exercising immediately and seek medical attention. 3)  You need to lose weight. Consider a lower calorie diet and regular exercise.  4)  Schedule your mammogram. 5)  Take an Aspirin every day. 6)  I will put in a request for physical therapy with Jeanene Erb at [facility] to treat your plantar fasciitis Prescriptions: KLOR-CON M20 20 MEQ  TBCR (POTASSIUM CHLORIDE CRYS CR) 2AM 2 PM  #400 x 4   Entered and Authorized by:   Roderick Pee MD   Signed by:   Roderick Pee MD on 02/23/2009   Method used:   Electronically to        [facility]* (retail)       [address] [place], [place]  [address]       Ph: [phone]       Fax: [phone]   RxID:   [id number] LASIX 20 MG  TABS (FUROSEMIDE) 1 TAB QAM  #100 x 4   Entered and Authorized by:   Roderick Pee MD   Signed by:   Roderick Pee MD on 02/23/2009  Method used:   Electronically to        [facility]* (retail)       [address] [place], [place]  [address]       Ph: [phone]       Fax: [phone]   RxID:   [id number] TENORETIC 50 50-25 MG  TABS (ATENOLOL-CHLORTHALIDONE) 1/2 QAM  #50 x 4   Entered and Authorized by:   Roderick Pee MD   Signed by:   Roderick Pee MD on 02/23/2009   Method used:   Electronically to        [facility]* (retail)       [address] [place], [place]  [address]       Ph: [phone]       Fax: [phone]   RxID:   [id number] KLOR-CON M20 20 MEQ  TBCR (POTASSIUM CHLORIDE CRYS CR) 2AM 2 PM  #400 x 4   Entered and Authorized by:   Roderick Pee MD   Signed by:    Roderick Pee MD on 02/23/2009   Method used:   [facility] [address] [phone]* (retail)       [address]       [place], [place]  [address]       Ph: [phone] or [phone]       Fax: [phone]   RxID:   [id number] LASIX 20 MG  TABS (FUROSEMIDE) 1 TAB QAM  #100 x 4   Entered and Authorized by:   Roderick Pee MD   Signed by:   Roderick Pee MD on 02/23/2009   Method used:   [facility] to        [facility] [address] [phone]* (retail)       [address]       [place], [place]  [address]       Ph: [phone] or [phone]       Fax: [phone]   RxID:   [id number] TENORETIC 50 50-25 MG  TABS (ATENOLOL-CHLORTHALIDONE) 1/2 QAM  #50 x 4   Entered and Authorized by:   Roderick Pee MD   Signed by:   Roderick Pee MD on 02/23/2009   Method used:   [facility]        [facility] [address] [phone]* (retail)       [address]       [place], [place]  [address]       Ph: [phone] or [phone]       Fax: [phone]   RxID:   [id number]     Appended Document: cpx-pap//ccm     Allergies: 1)  ! Sulfa  Review of Systems       Flu Vaccine Consent Questions     Do you have a history of severe allergic reactions to this vaccine? no    Any prior history of allergic reactions to egg and/or gelatin? no    Do you have a sensitivity to the preservative Thimersol? no    Do you have a past history of Guillan-Barre Syndrome? no  Do you currently have an acute febrile illness? no    Have you ever had a severe reaction to latex? no    Vaccine information given and explained to patient? yes    Are you currently pregnant? no    Lot Number:[id number]   Exp Date:10/12/2009   Site Given  Right  Deltoid IM    Complete Medication List: 1)  Tenoretic 50 50-25 Mg Tabs (Atenolol-chlorthalidone) .... 1/2 qam 2)  Adult Aspirin Low Strength  81 Mg Tbdp (Aspirin) 3)  Lasix 20 Mg Tabs (Furosemide) .Marland Kitchen.. 1 tab qam 4)  Klor-con M20 20 Meq Tbcr (Potassium chloride crys cr) .... 2am 2 pm 5)  Ca+ Vit D  6)  Nabumetone 750 Mg Tabs (Nabumetone) .... One tab two times a day  Other Orders: Admin 1st Vaccine ([address]) Flu Vaccine 35yrs + [phone])

## 2010-05-15 NOTE — Assessment & Plan Note (Signed)
Summary: FLU SHOT // RS  Nurse Visit   Allergies: 1)  ! Sulfa  Immunizations Administered:  Influenza Vaccine # 1:    Vaccine Type: Fluvax 3+    Site: left deltoid    Mfr: GlaxoSmithKline    Dose: 0.5 ml    Route: IM    Given by: Kathrynn Speed CMA    Exp. Date: 10/13/2010    Lot #: [id number]    VIS given: 11/07/09 version given January 11, 2010.  Flu Vaccine Consent Questions:    Do you have a history of severe allergic reactions to this vaccine? no    Any prior history of allergic reactions to egg and/or gelatin? no    Do you have a sensitivity to the preservative Thimersol? no    Do you have a past history of Guillan-Barre Syndrome? no    Do you currently have an acute febrile illness? no    Have you ever had a severe reaction to latex? no    Vaccine information given and explained to patient? yes    Are you currently pregnant? no  Orders Added: 1)  Flu Vaccine 60yrs + [90658] 2)  Admin 1st Vaccine [46962]

## 2010-05-15 NOTE — Progress Notes (Signed)
Summary: test results-mole  Phone Note Call from Patient Call back at Work Phone [phone]   Caller: patient triage message Call For: Tawanna Cooler Summary of Call: test results on mole  Initial call taken by: Roselle Locus,  April 29, 2007 11:32 AM  Follow-up for Phone Call        Phone Call Completed Follow-up by: Roderick Pee MD,  April 29, 2007 1:27 PM

## 2010-05-15 NOTE — Assessment & Plan Note (Signed)
Summary: cpx/pap/njr   Vital Signs:  Patient Profile:   55 Years Old Female Height:     63.75 inches (161.93 cm) Weight:      209 pounds (95.00 kg) Temp:     98.2 degrees F (36.78 degrees C) oral BP sitting:   124 / 80  (right arm)  Pt. in pain?   no  Vitals Entered By: Arcola Jansky, RN (February 11, 2007 8:52 AM)                  Chief Complaint:  CPX, LABS DONE, and PAP.  History of Present Illness: sandrais a 55 year old female who comes in today for physical evaluation because of underlying hypertension, postmenopausal symptoms.  A cyst in her right facial area, bilateral carpal tunnel syndrome.  She says, overall she's had a good year.  She seen Dr. Norlene Campbell for evaluation of carpal tunnel syndrome.  She said EMGs which is documented.  The problem.  She continues to wear splints, but did not completely resolve.  Leonette Most has a cyst that she noticed the right side of her, nose it's hard to find his son, increasing in size.  She's also one year postmenopausal with minimal postmenopausal symptoms.  She tried over-the-counter medications.  They don't help.  Her blood pressures under good control.  She is faithful about taking her medications.  She's up on all her health maintenance.  Activities.  We will give her a flu shot today.  She was encouraged to get a colonoscopy ever she wants to wait for next year.  She has no GI symptoms.  No family history of colon cancer or polyps.  Acute Visit History:      She denies abdominal pain, chest pain, constipation, cough, diarrhea, earache, eye symptoms, fever, genitourinary symptoms, headache, musculoskeletal symptoms, nasal discharge, nausea, rash, sinus problems, sore throat, and vomiting.         Current Allergies (reviewed today): ! SULFA  Past Medical History:    Reviewed history from 11/04/2006 and no changes required:       Hypertension       DUB       childbirth x 2       hysterectomy for uterine fibroid  reverse tubal ligation       migraine headaches   Social History:    Married    Never Smoked    Alcohol use-yes    Drug use-no    Regular exercise-no   Risk Factors:  Drug use:  no Exercise:  no   Review of Systems      See HPI   Physical Exam  General:     Well-developed,well-nourished,in no acute distress; alert,appropriate and cooperative throughout examination Head:     Normocephalic and atraumatic without obvious abnormalities. No apparent alopecia or balding. Eyes:     No corneal or conjunctival inflammation noted. EOMI. Perrla. Funduscopic exam benign, without hemorrhages, exudates or papilledema. Vision grossly normal. Ears:     External ear exam shows no significant lesions or deformities.  Otoscopic examination reveals clear canals, tympanic membranes are intact bilaterally without bulging, retraction, inflammation or discharge. Hearing is grossly normal bilaterally. Nose:     External nasal examination shows no deformity or inflammation. Nasal mucosa are pink and moist without lesions or exudates. Mouth:     Oral mucosa and oropharynx without lesions or exudates.  Teeth in good repair. Neck:     No deformities, masses, or tenderness noted. Chest Wall:  No deformities, masses, or tenderness noted. Breasts:     No mass, nodules, thickening, tenderness, bulging, retraction, inflamation, nipple discharge or skin changes noted.   Lungs:     Normal respiratory effort, chest expands symmetrically. Lungs are clear to auscultation, no crackles or wheezes. Heart:     Normal rate and regular rhythm. S1 and S2 normal without gallop, murmur, click, rub or other extra sounds. Abdomen:     Bowel sounds positive,abdomen soft and non-tender without masses, organomegaly or hernias noted. Rectal:     No external abnormalities noted. Normal sphincter tone. No rectal masses or tenderness. Genitalia:     Pelvic Exam:        External: normal female genitalia without lesions  or masses        Vagina: normal without lesions or masses        Cervix: normal without lesions or masses        Adnexa: normal bimanual exam without masses or fullness        Uterus: normal by palpation        Pap smear: performed Msk:     No deformity or scoliosis noted of thoracic or lumbar spine.   Pulses:     R and L carotid,radial,femoral,dorsalis pedis and posterior tibial pulses are full and equal bilaterally Extremities:     No clubbing, cyanosis, edema, or deformity noted with normal full range of motion of all joints.   Neurologic:     No cranial nerve deficits noted. Station and gait are normal. Plantar reflexes are down-going bilaterally. DTRs are symmetrical throughout. Sensory, motor and coordinative functions appear intact. Skin:     totaled skin exam was normal, except a black freckle on her left knee, and a black charcoal on her right lower foot.  The will get her back to her reexcision aches S. AP Cervical Nodes:     No lymphadenopathy noted Axillary Nodes:     No palpable lymphadenopathy Inguinal Nodes:     No significant adenopathy Psych:     Cognition and judgment appear intact. Alert and cooperative with normal attention span and concentration. No apparent delusions, illusions, hallucinations    Impression & Recommendations:  Problem # 1:  HYPERTENSION (ICD-401.9) Assessment: Unchanged  Her updated medication list for this problem includes:    Tenoretic 50 50-25 Mg Tabs (Atenolol-chlorthalidone) .Marland Kitchen... 1/2 qam    Lasix 20 Mg Tabs (Furosemide) .Marland Kitchen... 1 tab qam   Problem # 2:  PHYSICAL EXAMINATION (ICD-V70.0) Assessment: Unchanged  Orders: EKG w/ Interpretation (93000)   Problem # 3:  Preventive Health Care (ICD-V70.0) lot [id number], EXP 30 jun 09, sanofi pasteur left deltoid IM, 0.5 cc.   Complete Medication List: 1)  Tenoretic 50 50-25 Mg Tabs (Atenolol-chlorthalidone) .... 1/2 qam 2)  Adult Aspirin Low Strength 81 Mg Tbdp (Aspirin) 3)  Lasix 20  Mg Tabs (Furosemide) .Marland Kitchen.. 1 tab qam 4)  Klor-con M20 20 Meq Tbcr (Potassium chloride crys cr) .... 2am 2 pm 5)  Ca+ Vit D   Other Orders: Flu Vaccine 54yrs + ([address]) Admin 1st Vaccine ([address])   Patient Instructions: 1)  Please schedule a follow-up appointment in 1 year. 2)  It is important that you exercise regularly at least 20 minutes 5 times a week. If you develop chest pain, have severe difficulty breathing, or feel very tired , stop exercising immediately and seek medical attention. 3)  You need to lose weight. Consider a lower calorie diet and regular exercise.  4)  Schedule your  mammogram. 5)  Take calcium +Vitamin D daily. 6)  Take an Aspirin every day.    Prescriptions: KLOR-CON M20 20 MEQ  TBCR (POTASSIUM CHLORIDE CRYS CR) 2AM 2 PM  #400 x 4   Entered and Authorized by:   Roderick Pee MD   Signed by:   Roderick Pee MD on 02/11/2007   Method used:   Electron[facility] to ...       [facility] [address] [phone]*       [address]       [place], [place]  [address]       Ph: [phone] or [phone]       Fax: [phone]   RxID:   [id number] LASIX 20 MG  TABS (FUROSEMIDE) 1 TAB QAM  #100 x 4   Entered and Authorized by:   Roderick Pee MD   Signed by:   Roderick Pee MD on 02/11/2007   Method used:   Electronically sent to ...       [facility] [address] [phone]*       [address]       [place], [place]  [address]       Ph: [phone] or [phone]       Fax: [phone]   RxID:   [id number] TENORETIC 50 50-25 MG  TABS (ATENOLOL-CHLORTHALIDONE) 1/2 QAM  #50 x 4   Entered and Authorized by:   Roderick Pee MD   Signed by:   Roderick Pee MD on 02/11/2007   Method used:   Electronically sent to ...       [facility] [address] [phone]*       [address]       [place], [place]  [address]       Ph: [phone] or  [phone]       Fax: [phone]   RxID:   [id number]  ]  Influenza Vaccine    Vaccine Type: Fluvax 3+    Given by: Arcola Jansky, RN  Flu Vaccine Consent Questions    Do you have a history of severe allergic reactions to this vaccine? no    Any prior history of allergic reactions to egg and/or gelatin? no    Do you have a sensitivity to the preservative Thimersol? no    Do you have a past history of Guillan-Barre Syndrome? no    Do you currently have an acute febrile illness? no    Have you ever had a severe reaction to latex? no    Vaccine information given and explained to patient? yes    Are you currently pregnant? no

## 2010-05-15 NOTE — Assessment & Plan Note (Signed)
Summary: re-excision mole/njr    Procedure Note Last Tetanus: Historical (04/15/2002)  Mole Biopsy/Removal: Indication: suspicious lesion  Procedure # 1: elliptical incision with 4 mm margin    Size (in cm): 1.5 x 1.5    Region: anterior    Location: left lower leg    Instrument used: #15 blade    Anesthesia: 1% lidocaine w/epinephrine   Chief Complaint:  3 excision of mole, left knee because of question of lesion, and extending to the margins.  History of Present Illness: Katie Richards is a 55-year-old female comes back today for reexcision of a lesion on her left knee.  It was removed 4 weeks ago and the path report said cannot rule out early melanoma versus severe dysplasia.  They felt like there was a question of whether the U. margins were involved and recommended wide excision.  She comes in today for that procedure.  Current Allergies: ! SULFA        Complete Medication List: 1)  Tenoretic 50 50-25 Mg Tabs (Atenolol-chlorthalidone) .... 1/2 qam 2)  Adult Aspirin Low Strength 81 Mg Tbdp (Aspirin) 3)  Lasix 20 Mg Tabs (Furosemide) .Marland Kitchen.. 1 tab qam 4)  Klor-con M20 20 Meq Tbcr (Potassium chloride crys cr) .... 2am 2 pm 5)  Ca+ Vit D    Patient Instructions: 1)  continued local wound care as directed previously.  I will cardiomegaly to report    ]

## 2010-05-15 NOTE — Progress Notes (Signed)
Summary: Nasal cyst from summer 08 finally broke  Phone Note Call from Patient Call back at [facility] Phone [phone]   Caller: Patient Call For: Tawanna Cooler Summary of Call: Pt called to report the cyst that was in the inside of her nose "ruptured" yesterday.  The area drained yesterday and gave her an upset stomach.  Today the draining has subsided, stomach feels better, no swelling or reddness to nose area.  Pt questioning if she should do anything special to this area.  Does she need an antibiotic because of the cyst breaking? [facility] to leave a message on home phone above Initial call taken by: Sid Falcon LPN,  June 12, 2007 8:36 AM  Follow-up for Phone Call        call patient referred to Dr. Narda Bonds for further evaluation. P.r.n. Follow-up by: Roderick Pee MD,  June 13, 2007 1:00 PM

## 2010-05-15 NOTE — Progress Notes (Signed)
  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  However, the margins were free.  The one her left knee was also very dysplastic with a what they say was an early stage melanoma cannot be excluded.  Therefore come back for reexcision of lesion on her left knee sometime in the next week or two. Initial call taken by: Roderick Pee MD,  March 14, 2007 11:01 AM

## 2010-05-15 NOTE — Progress Notes (Signed)
Summary: Pt called re: script sent over by [facility] for patch  Phone Note Call from Patient   Caller: Patient Summary of Call: Pt called and said that [facility] sent a prescription req for a patch to go on pts foot and this was sent over by fax on 03/17/09 to Dr. Tawanna Cooler. Pt went to [facility] for therapy yesterday and was told that they had not recvd the prescription so they could not the complete therapy session. Pt is suppose to be going back to [facility] tomorrow at 7am, but needs to make sure that script has been done, so she can have her therapy. Pt is having [facility] refax the script over to Dr. Tawanna Cooler today, please make sure this gets done.  Initial call taken by: Lucy Antigua,  March 22, 2009 8:29 AM  Follow-up for Phone Call          Fleet Contras please call this doesn't make any sense Follow-up by: Roderick Pee MD,  March 22, 2009 9:01 AM  Additional Follow-up for Phone Call Additional follow up Details #1::        patient sees dr Cleophas Dunker.  I called and left a message for the nurse to fax over the rx. Additional Follow-up by: Kern Reap CMA [name],  March 22, 2009 12:07 PM    Additional Follow-up for Phone Call Additional follow up Details #2::    signed and faxed Follow-up by: Kern Reap CMA [name],  March 23, 2009 12:13 PM

## 2010-05-15 NOTE — Procedures (Signed)
Summary: Colonoscopy   Colonoscopy  Procedure date:  03/28/2008  Findings:      Location:  [facility].    Procedures Next Due Date:    Colonoscopy: 03/2018  COLONOSCOPY PROCEDURE REPORT  PATIENT:  Morriston, Carmack  MR#:  [id number] BIRTHDATE:   1955-12-30   GENDER:   female  ENDOSCOPIST:   Rachael Fee, MD Referred by:  Eugenio Hoes. Tawanna Cooler, M.D.  PROCEDURE DATE:  03/28/2008 PROCEDURE:  Colonoscopy, diagnostic ASA CLASS:   Class II INDICATIONS: family Hx of polyps, brother  MEDICATIONS:    Versed 8 mg IV, Fentanyl 100 mcg IV  DESCRIPTION OF PROCEDURE:   After the risks benefits and alternatives of the procedure were thoroughly explained, informed consent was obtained.  Digital rectal exam was performed and revealed no abnormalities.   The LB CF-[id number] endoscope was introduced through the anus and advanced to the cecum, which was identified by both the appendix and ileocecal valve, without limitations.  The quality of the prep was good.  The instrument was then slowly withdrawn as the colon was fully examined. <<PROCEDUREIMAGES>>    <<OLD IMAGES>>  FINDINGS:  External hemorrhoids were found in the anal canal., small.  The examination was otherwise normal (see image1, image2, and image3).  No polyps or cancers were seen.   Retroflexed views in the rectum revealed no abnormalities.    The scope was then withdrawn from the patient and the procedure completed.  COMPLICATIONS:   None  ENDOSCOPIC IMPRESSION:  1) External hemorrhoids in the anal canal  2) Otherwise normal examination  3) No polyps or cancers  RECOMMENDATIONS:  1) Should follow current colorectal cancer screening guidelines for routine risk patients with a repeat colonoscopy in 10 years.      REPEAT EXAM:   In 10 year(s) for Colonoscopy.   _______________________________ Rachael Fee, MD    This report was created from the original endoscopy report, which was reviewed and  signed by the above listed endoscopist.    cc: Kelle Darting, MD

## 2010-05-15 NOTE — Progress Notes (Signed)
Summary: pathology F/U-LMTCB  Phone Note Outgoing Call   Call placed by: Arcola Jansky, RN,  March 16, 2007 1:55 PM Call placed to: [phone] Summary of Call: Pt needs F/u of pathology report. Her voicemail at work says she will be out ot the office through mon dec 1st.  I left a message saying that she needs to call us about her path report , we need a F/U visit. Initial call taken by: Arcola Jansky, RN,  March 16, 2007 1:56 PM  Follow-up for Phone Call        Provider notified Follow-up by: Arcola Jansky, RN,  March 17, 2007 5:47 PM

## 2010-05-15 NOTE — Assessment & Plan Note (Signed)
Summary: CHEST CONGESTION,COUGHING UP BROWNISH MUSCUS/JLS   Vital Signs:  Patient profile:   55 year old female Height:      63.75 inches Weight:      209 pounds BMI:     36.29 Temp:     98.3 degrees F BP sitting:   120 / 80  (left arm) Cuff size:   regular  Vitals Entered By: Kern Reap CMA (July 20, 2008 11:07 AM)  Reason for Visit sinus, chest congestion  History of Present Illness: Katie Richards is a 55 year old female, who comes in today for evaluation of allergic rhinitis.  Eight days ago.  She had the onset of allergic rhinitis with sneezing, runny nose, watery eyes and congestion, postnasal drip.  She is a nonsmoker, but did have asthma as a child, none as an adult.  She is not taking any over-the-counter medications.  Because of her hypertension.  BP today 120/80  Allergies: 1)  ! Sulfa  Past History:  Past Medical History:    Hypertension    DUB    childbirth x 2    hysterectomy for uterine fibroid    reverse tubal ligation    migraine headaches    tennismis    Allergic rhinitis  Social History:    Reviewed history from 02/11/2007 and no changes required:       Married       Never Smoked       Alcohol use-yes       Drug use-no       Regular exercise-no  Review of Systems      See HPI  Physical Exam  General:  Well-developed,well-nourished,in no acute distress; alert,appropriate and cooperative throughout examination Head:  Normocephalic and atraumatic without obvious abnormalities. No apparent alopecia or balding. Eyes:  No corneal or conjunctival inflammation noted. EOMI. Perrla. Funduscopic exam benign, without hemorrhages, exudates or papilledema. Vision grossly normal. Ears:  External ear exam shows no significant lesions or deformities.  Otoscopic examination reveals clear canals, tympanic membranes are intact bilaterally without bulging, retraction, inflammation or discharge. Hearing is grossly normal bilaterally. Nose:  External nasal examination  shows no deformity or inflammation. Nasal mucosa are pink and moist without lesions or exudates. Mouth:  Oral mucosa and oropharynx without lesions or exudates.  Teeth in good repair. Neck:  No deformities, masses, or tenderness noted. Chest Wall:  No deformities, masses, or tenderness noted. Lungs:  Normal respiratory effort, chest expands symmetrically. Lungs are clear to auscultation, no crackles or wheezes.   Impression & Recommendations:  Problem # 1:  ALLERGIC RHINITIS (ICD-477.9) Assessment Deteriorated  Complete Medication List: 1)  Tenoretic 50 50-25 Mg Tabs (Atenolol-chlorthalidone) .... 1/2 qam 2)  Adult Aspirin Low Strength 81 Mg Tbdp (Aspirin) 3)  Lasix 20 Mg Tabs (Furosemide) .Marland Kitchen.. 1 tab qam 4)  Klor-con M20 20 Meq Tbcr (Potassium chloride crys cr) .... 2am 2 pm 5)  Ca+ Vit D  6)  Prednisone 20 Mg Tabs (Prednisone) .... Uad  Patient Instructions: 1)  you can take Claritin plain, 10 mg in the morning or the Ceretec plain, 10 mg at bedtime.  You can also use a saline nasal spray irrigation at bedtime and for 5 nights, you can premedicate your nose with Afrin. 2)  Another option would be to take a short course of prednisone two tablets daily for 3 days, one for 3 days, a half a tablet a day for 3 days, then half a tablet Monday, Wednesday, Friday, for a two-week taper. Prescriptions: PREDNISONE  20 MG TABS (PREDNISONE) UAD  #30 x 1   Entered and Authorized by:   Roderick Pee MD   Signed by:   Roderick Pee MD on 07/20/2008   Method used:   Electronically to        [facility] [phone][facility] (retail)       [address]       [place], [place]  [id number]       Ph: [phone] or [phone]       Fax: [phone]   RxID:   [id number]

## 2010-08-28 NOTE — Op Note (Signed)
NAMEEVALISE, ABRUZZESE            ACCOUNT NO.:  [id number]   MEDICAL RECORD NO.:  [id number]          PATIENT TYPE:  AMB   LOCATION:  DSC                          FACILITY:  [facility]   PHYSICIAN:  Katy Fitch. Sypher, M.D. DATE OF BIRTH:  13-Aug-1955   DATE OF PROCEDURE:  11/06/2007  DATE OF DISCHARGE:                               OPERATIVE REPORT   PREOPERATIVE DIAGNOSIS:  Entrapment neuropathy, right median nerve,  carpal tunnel.   POSTOPERATIVE DIAGNOSIS:  Entrapment neuropathy, right median nerve,  carpal tunnel.   OPERATIONS:  Release of right transcarpal ligament.   OPERATING SURGEON:  Katy Fitch. Sypher, MD.   ASSISTANT:  Marveen Reeks Dasnoit, PA-C   ANESTHESIA:  General by LMA.   SUPERVISING ANESTHESIOLOGIST:  Zenon Mayo, MD.   INDICATIONS:  Shawn is a 55 year old woman referred through the  courtesy of Dr. Alonza Smoker for evaluation of chronic hand numbness.  Clinical examination revealed signs of bilateral carpal tunnel syndrome.  Electrodiagnostic studies revealed evidence of significant bilateral  carpal tunnel syndrome, right worse than left.   After informed consent, she was brought to the operating room at this  time for release of her right transcarpal ligament.   PROCEDURE IN DETAIL:  Tianna Baus was brought to the operating room  and placed in supine position on the operating table.   Following an anesthesia consult by Dr. Sampson Goon, general anesthesia by  LMA was recommended and accepted by Ms. Cienfuegos.   She was brought to room #1, placed in supine position on the operating  table.  Under Dr. Jarrett Ables direct supervision, general anesthesia by  LMA technique induced.   The right arm was prepped with Betadine soap and solution, sterilely  draped.  A pneumatic tourniquet was applied to the proximal right  brachium.  After full exsanguination of the right arm with an Esmarch  bandage, arterial tourniquet was inflated to 220 mmHg.  Procedure  commenced with short incision in the line of the ring finger of the  palm.  Subcutaneous tissues were carefully divided to the palmar fascia.  This split longitudinally to the common sensory branch of the median  nerve.  These were followed back to transcarpal ligament, which was  gently isolated at the median nerve. The ligament was then released  along its ulnar border extending into the distal forearm.   This widely opened carpal canal.  No mass or other  __________ were  noted.  Bleeding points along the margin of the released ligament were  not problematic.  The wound was then repaired with intradermal through a  Prolene suture.   A compressive dressing was applied with volar plaster splint maintaining  the wrist in 5 degrees of dorsiflexion.  For aftercare,  Ms. Cofield  was provided with prescription for Vicodin 5 mg 1 p.o. every 4-6 hours  p.r.n. pain, 20 tablets without refill.        Katy Fitch Sypher, M.D.  Electronically Signed     RVS/MEDQ  D:  11/06/2007  T:  11/06/2007  Job:  [id number]   cc:   Tinnie Gens A. Tawanna Cooler, MD

## 2010-08-28 NOTE — Op Note (Signed)
NAMEKELLAN, [name]            ACCOUNT NO.:  [id number]   MEDICAL RECORD NO.:  [id number]          PATIENT TYPE:  AMB   LOCATION:  DSC                          FACILITY:  [facility]   PHYSICIAN:  Katy Fitch. Sypher, M.D. DATE OF BIRTH:  [birth year]   DATE OF PROCEDURE:  02/02/2008  DATE OF DISCHARGE:                               OPERATIVE REPORT   PREOPERATIVE DIAGNOSIS:  Entrapped neuropathy median nerve, left carpal  tunnel.   POSTOPERATIVE DIAGNOSIS:  Entrapped neuropathy median nerve, left carpal  tunnel.   OPERATION:  Release of left transverse carpal ligament.   OPERATING SURGEON:  Katy Fitch. Sypher, MD   ASSISTANT:  Marveen Reeks Dasnoit, PA-C   ANESTHESIA:  General by LMA.  Supervising anesthesiologist is Dr.  Jacklynn Bue.   INDICATIONS:  Katie Richards is a 55 year old woman employed at [employer].  She has a history of bilateral hand numbness.  She was seen  in consultation, noted to have evidence of bilateral carpal tunnel  syndrome, and electrodiagnostic studies confirmed bilateral median  neuropathy at wrist level.   She is status post release of her right transverse carpal ligament with  a satisfactory outcome.  She now presents for similar surgery on the  left.  After informed consent, she is brought to the operating room at  this time.   PROCEDURE:  Katie Richards was brought to the operating room and  placed in a supine position on the operating table.   Following the induction of general anesthesia by LMA technique, the  right arm was prepped with Betadine soap and solution and sterilely  draped.  A pneumatic tourniquet was applied to the proximal right  brachium.   Following exsanguination of the right arm with an Esmarch bandage,  arterial tourniquet was inflated to 220 mmHg.  The procedure commenced  with a short incision in the line of the ring finger in the palm.  Subcutaneous tissues were carefully divided revealing the palmar fascia.  This split  longitudinally to reveal the common sensory branches of the  median nerve.  These were followed back to the transverse carpal  ligament, which was gently isolated to median nerve.  Ligament was then  released along its ulnar border extending into the distal forearm.  This  widely opened carpal canal.  No masses or other predicaments were noted.  Bleeding points along the margin of the released transverse carpal  ligament were isolated and electrocauterized with bipolar forceps.   The wound was then repaired with intradermal 3-0 Prolene suture.  A  compressive dressing was applied with a volar plaster splint maintaining  the wrist in 5 degrees of dorsiflexion.   There were no apparent complications.   For aftercare, Katie Richards was provided a prescription for Percocet 5  mg 1 p.o. q. 4-6 h. p.r.n. pain, #20 tablets without refill.   We will see her back in followup in our office in 1 week.      Katy Fitch Sypher, M.D.  Electronically Signed     RVS/MEDQ  D:  02/02/2008  T:  02/02/2008  Job:  [id number]

## 2011-01-11 LAB — BASIC METABOLIC PANEL
BUN: 12
CO2: 28
Calcium: 10
Chloride: 106
Creatinine, Ser: 1.02
GFR calc Af Amer: >60
GFR calc non Af Amer: 57 — ABNORMAL LOW
Glucose, Bld: 97
Potassium: 3.5
Sodium: 142

## 2011-01-11 LAB — POCT HEMOGLOBIN-HEMACUE: Hemoglobin: 12

## 2011-01-14 LAB — BASIC METABOLIC PANEL
BUN: 14
CO2: 27
Calcium: 9.8
Chloride: 107
Creatinine, Ser: 0.8
GFR calc Af Amer: >60
GFR calc non Af Amer: >60
Glucose, Bld: 87
Potassium: 4.1
Sodium: 142

## 2011-01-14 LAB — POCT HEMOGLOBIN-HEMACUE: Hemoglobin: 12.1

## 2011-02-06 ENCOUNTER — Other Ambulatory Visit: Payer: Self-pay | Admitting: Family Medicine

## 2011-02-06 DIAGNOSIS — Z1231 Encounter for screening mammogram for malignant neoplasm of breast: Secondary | ICD-10-CM

## 2011-02-26 ENCOUNTER — Other Ambulatory Visit (INDEPENDENT_AMBULATORY_CARE_PROVIDER_SITE_OTHER): Payer: BC Managed Care – PPO

## 2011-02-26 DIAGNOSIS — Z0000 Encounter for general adult medical examination without abnormal findings: Secondary | ICD-10-CM

## 2011-02-26 LAB — CBC WITH DIFFERENTIAL/PLATELET
Basophils Absolute: 0 10*3/uL (ref 0.0–0.1)
Basophils Relative: 0.3 % (ref 0.0–3.0)
Eosinophils Absolute: 0 10*3/uL (ref 0.0–0.7)
Eosinophils Relative: 1 % (ref 0.0–5.0)
HCT: 38.3 % (ref 36.0–46.0)
Hemoglobin: 12.7 g/dL (ref 12.0–15.0)
Lymphocytes Relative: 28.8 % (ref 12.0–46.0)
Lymphs Abs: 1.3 10*3/uL (ref 0.7–4.0)
MCHC: 33.2 g/dL (ref 30.0–36.0)
MCV: 86.4 fl (ref 78.0–100.0)
Monocytes Absolute: 0.3 10*3/uL (ref 0.1–1.0)
Monocytes Relative: 6.7 % (ref 3.0–12.0)
Neutro Abs: 3 10*3/uL (ref 1.4–7.7)
Neutrophils Relative %: 63.2 % (ref 43.0–77.0)
Platelets: 183 10*3/uL (ref 150.0–400.0)
RBC: 4.44 Mil/uL (ref 3.87–5.11)
RDW: 14.4 % (ref 11.5–14.6)
WBC: 4.7 10*3/uL (ref 4.5–10.5)

## 2011-02-26 LAB — HEPATIC FUNCTION PANEL
ALT: 19 U/L (ref 0–35)
AST: 23 U/L (ref 0–37)
Albumin: 4 g/dL (ref 3.5–5.2)
Alkaline Phosphatase: 55 U/L (ref 39–117)
Bilirubin, Direct: 0 mg/dL (ref 0.0–0.3)
Total Bilirubin: 0.8 mg/dL (ref 0.3–1.2)
Total Protein: 7.5 g/dL (ref 6.0–8.3)

## 2011-02-26 LAB — BASIC METABOLIC PANEL
BUN: 17 mg/dL (ref 6–23)
CO2: 28 mEq/L (ref 19–32)
Calcium: 9.1 mg/dL (ref 8.4–10.5)
Chloride: 104 mEq/L (ref 96–112)
Creatinine, Ser: 0.9 mg/dL (ref 0.4–1.2)
GFR: 86.05 mL/min (ref >60.00)
Glucose, Bld: 90 mg/dL (ref 70–99)
Potassium: 3.4 mEq/L — ABNORMAL LOW (ref 3.5–5.1)
Sodium: 143 mEq/L (ref 135–145)

## 2011-02-26 LAB — POCT URINALYSIS DIPSTICK
Bilirubin, UA: NEGATIVE
Blood, UA: NEGATIVE
Glucose, UA: NEGATIVE
Ketones, UA: NEGATIVE
Leukocytes, UA: NEGATIVE
Nitrite, UA: NEGATIVE
Protein, UA: NEGATIVE
Spec Grav, UA: 1.015
Urobilinogen, UA: 0.2
pH, UA: 6.5

## 2011-02-26 LAB — LIPID PANEL
Cholesterol: 174 mg/dL (ref 0–200)
HDL: 66.3 mg/dL (ref >39.00)
LDL Cholesterol: 95 mg/dL (ref 0–99)
Total CHOL/HDL Ratio: 3
Triglycerides: 63 mg/dL (ref 0.0–149.0)
VLDL: 12.6 mg/dL (ref 0.0–40.0)

## 2011-02-26 LAB — TSH: TSH: 2.59 u[IU]/mL (ref 0.35–5.50)

## 2011-03-04 ENCOUNTER — Ambulatory Visit
Admission: RE | Admit: 2011-03-04 | Discharge: 2011-03-04 | Disposition: A | Discharge status: Home / Self Care | Payer: BC Managed Care – PPO | Source: Ambulatory Visit | Attending: Family Medicine | Admitting: Family Medicine

## 2011-03-04 ENCOUNTER — Encounter: Payer: Self-pay | Admitting: Family Medicine

## 2011-03-04 DIAGNOSIS — Z1231 Encounter for screening mammogram for malignant neoplasm of breast: Secondary | ICD-10-CM

## 2011-03-05 ENCOUNTER — Ambulatory Visit (INDEPENDENT_AMBULATORY_CARE_PROVIDER_SITE_OTHER): Payer: BC Managed Care – PPO | Admitting: Family Medicine

## 2011-03-05 ENCOUNTER — Other Ambulatory Visit (HOSPITAL_COMMUNITY)
Admission: RE | Admit: 2011-03-05 | Discharge: 2011-03-05 | Disposition: A | Discharge status: Home / Self Care | Payer: BC Managed Care – PPO | Source: Ambulatory Visit | Attending: Family Medicine | Admitting: Family Medicine

## 2011-03-05 ENCOUNTER — Encounter: Payer: Self-pay | Admitting: Family Medicine

## 2011-03-05 DIAGNOSIS — N952 Postmenopausal atrophic vaginitis: Secondary | ICD-10-CM

## 2011-03-05 DIAGNOSIS — I872 Venous insufficiency (chronic) (peripheral): Secondary | ICD-10-CM

## 2011-03-05 DIAGNOSIS — M1990 Unspecified osteoarthritis, unspecified site: Secondary | ICD-10-CM

## 2011-03-05 DIAGNOSIS — I10 Essential (primary) hypertension: Secondary | ICD-10-CM

## 2011-03-05 DIAGNOSIS — M26629 Arthralgia of temporomandibular joint, unspecified side: Secondary | ICD-10-CM

## 2011-03-05 DIAGNOSIS — J309 Allergic rhinitis, unspecified: Secondary | ICD-10-CM

## 2011-03-05 DIAGNOSIS — M2669 Other specified disorders of temporomandibular joint: Secondary | ICD-10-CM

## 2011-03-05 DIAGNOSIS — Z0000 Encounter for general adult medical examination without abnormal findings: Secondary | ICD-10-CM

## 2011-03-05 DIAGNOSIS — Z01419 Encounter for gynecological examination (general) (routine) without abnormal findings: Secondary | ICD-10-CM | POA: Insufficient documentation

## 2011-03-05 DIAGNOSIS — Z23 Encounter for immunization: Secondary | ICD-10-CM

## 2011-03-05 MED ORDER — ESTRADIOL 0.1 MG/GM VA CREA: 1.0000 g | TOPICAL_CREAM | Freq: Every day | VAGINAL | Status: AC

## 2011-03-05 MED ORDER — AMITRIPTYLINE HCL 25 MG PO TABS: 25.0000 mg | ORAL_TABLET | Freq: Every day | ORAL | Status: DC

## 2011-03-05 MED ORDER — POTASSIUM CHLORIDE CRYS ER 20 MEQ PO TBCR: 40.0000 meq | EXTENDED_RELEASE_TABLET | Freq: Every day | ORAL | Status: DC

## 2011-03-05 MED ORDER — ATENOLOL-CHLORTHALIDONE 50-25 MG PO TABS: 1.0000 | ORAL_TABLET | Freq: Every day | ORAL | Status: DC

## 2011-03-05 MED ORDER — FUROSEMIDE 20 MG PO TABS: 20.0000 mg | ORAL_TABLET | ORAL | Status: DC

## 2011-03-05 NOTE — Progress Notes (Signed)
  Subjective:    Patient ID: Katie Richards, female    DOB: [birth year], 55 y.o.   MRN: [id number]  HPI Lakshmi is a 55 year old, married female, nonsmoker, who comes in today for general physical examination because of a history of hypertension, postmenopausal vaginal dryness and venous insufficiency, and two.  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  Her med list reviewed.  No changes.  She gets routine eye care, dental care, BSE monthly, and a mammography, colonoscopy, normal, tetanus, 2004, seasonal flu shot today   Review of Systems  HENT: Positive for ear pain.   Musculoskeletal: Positive for arthralgias and gait problem.       Objective:   Physical Exam  Constitutional: She appears well-developed and well-nourished.  HENT:  Head: Normocephalic and atraumatic.  Right Ear: External ear normal.  Left Ear: External ear normal.  Nose: Nose normal.  Mouth/Throat: Oropharynx is clear and moist.  Eyes: EOM are normal. Pupils are equal, round, and reactive to light.  Neck: Normal range of motion. Neck supple. No thyromegaly present.  Cardiovascular: Normal rate, regular rhythm, normal heart sounds and intact distal pulses.  Exam reveals no gallop and no friction rub.   No murmur heard. Pulmonary/Chest: Effort normal and breath sounds normal.  Abdominal: Soft. Bowel sounds are normal. She exhibits no distension and no mass. There is no tenderness. There is no rebound.  Genitourinary: Vagina normal and uterus normal. Guaiac negative stool. No vaginal discharge found.       Bilateral breast exam normal  Musculoskeletal: Normal range of motion.       External rotation limited to 45 degrees of both hips  Lymphadenopathy:    She has no cervical adenopathy.  Neurological: She is alert. She has normal reflexes. No cranial nerve deficit. She exhibits normal muscle tone. Coordination normal.  Skin: Skin is warm and dry.  Psychiatric: She has a normal mood and  affect. Her behavior is normal. Judgment and thought content normal.          Assessment & Plan:  Healthy female.  Hypertension.  Continue current medication.  Postmenopausal vaginal dryness.  Continue current medication.  Osteoarthritis mainly affecting the hips.  Motrin, 600 b.i.d. Begin exercise program.  TMJ mouth guard.  Soft diet, Elavil, 25 mg nightly  Allergic rhinitis OTC, Zyrtec, plain

## 2011-03-05 NOTE — Patient Instructions (Signed)
Continue your current medications.  Take 600 mg of Motrin twice daily with food and do the stretching exercises daily.  For the TMJ.  We recommend a soft diet, no chewing gum, Elavil, 25 mg a day at bedtime, along with a mouth guard.  Take plain Zyrtec 10 mg at bedtime for allergic rhinitis.  Return in one year or sooner if any problems

## 2011-10-28 ENCOUNTER — Other Ambulatory Visit: Payer: Self-pay | Admitting: Family Medicine

## 2011-11-19 ENCOUNTER — Telehealth: Payer: Self-pay | Admitting: Family Medicine

## 2011-11-19 NOTE — Telephone Encounter (Signed)
Left message on machine for patient

## 2011-11-19 NOTE — Telephone Encounter (Signed)
Fleet Contras please call Katie Richards,,,,,,,,, any postmenopausal spotting can be indicative of uterine cancer referred her to [facility] Dr. Arlyce Dice and group. She can call and make her an appointment just re\re remind her to use her name as a referral Dr.

## 2011-11-19 NOTE — Telephone Encounter (Signed)
Pt is experiencing spotting for a week and a half and cramping since this morning. Pt has not had a menstrual cycle in 10 years and is concerned and would like to be seen today

## 2011-12-02 ENCOUNTER — Other Ambulatory Visit (HOSPITAL_COMMUNITY): Payer: Self-pay | Admitting: Gynecology

## 2011-12-02 DIAGNOSIS — N950 Postmenopausal bleeding: Secondary | ICD-10-CM

## 2011-12-04 ENCOUNTER — Ambulatory Visit (HOSPITAL_COMMUNITY)
Admission: RE | Admit: 2011-12-04 | Discharge: 2011-12-04 | Disposition: A | Discharge status: Home / Self Care | Payer: BC Managed Care – PPO | Source: Ambulatory Visit | Attending: Gynecology | Admitting: Gynecology

## 2011-12-04 DIAGNOSIS — D259 Leiomyoma of uterus, unspecified: Secondary | ICD-10-CM | POA: Insufficient documentation

## 2011-12-04 DIAGNOSIS — N950 Postmenopausal bleeding: Secondary | ICD-10-CM | POA: Insufficient documentation

## 2011-12-12 ENCOUNTER — Other Ambulatory Visit: Payer: Self-pay

## 2012-01-29 ENCOUNTER — Other Ambulatory Visit: Payer: Self-pay | Admitting: Family Medicine

## 2012-01-29 DIAGNOSIS — Z1231 Encounter for screening mammogram for malignant neoplasm of breast: Secondary | ICD-10-CM

## 2012-02-24 ENCOUNTER — Other Ambulatory Visit: Payer: BC Managed Care – PPO | Admitting: Family Medicine

## 2012-02-24 ENCOUNTER — Ambulatory Visit (INDEPENDENT_AMBULATORY_CARE_PROVIDER_SITE_OTHER): Payer: BC Managed Care – PPO | Admitting: *Deleted

## 2012-02-24 DIAGNOSIS — Z23 Encounter for immunization: Secondary | ICD-10-CM

## 2012-03-11 ENCOUNTER — Ambulatory Visit: Payer: BC Managed Care – PPO

## 2012-03-20 ENCOUNTER — Ambulatory Visit
Admission: RE | Admit: 2012-03-20 | Discharge: 2012-03-20 | Disposition: A | Discharge status: Home / Self Care | Payer: BC Managed Care – PPO | Source: Ambulatory Visit | Attending: Family Medicine | Admitting: Family Medicine

## 2012-03-20 DIAGNOSIS — Z1231 Encounter for screening mammogram for malignant neoplasm of breast: Secondary | ICD-10-CM

## 2012-04-02 ENCOUNTER — Other Ambulatory Visit: Payer: BC Managed Care – PPO

## 2012-04-09 ENCOUNTER — Encounter: Payer: BC Managed Care – PPO | Admitting: Family Medicine

## 2012-04-13 ENCOUNTER — Other Ambulatory Visit (INDEPENDENT_AMBULATORY_CARE_PROVIDER_SITE_OTHER): Payer: BC Managed Care – PPO

## 2012-04-13 DIAGNOSIS — Z0000 Encounter for general adult medical examination without abnormal findings: Secondary | ICD-10-CM

## 2012-04-13 LAB — BASIC METABOLIC PANEL
BUN: 17 mg/dL (ref 6–23)
CO2: 28 mEq/L (ref 19–32)
Calcium: 9.2 mg/dL (ref 8.4–10.5)
Chloride: 104 mEq/L (ref 96–112)
Creatinine, Ser: 0.9 mg/dL (ref 0.4–1.2)
GFR: 89.19 mL/min (ref >60.00)
Glucose, Bld: 91 mg/dL (ref 70–99)
Potassium: 4.1 mEq/L (ref 3.5–5.1)
Sodium: 141 mEq/L (ref 135–145)

## 2012-04-13 LAB — POCT URINALYSIS DIPSTICK
Bilirubin, UA: NEGATIVE
Glucose, UA: NEGATIVE
Ketones, UA: NEGATIVE
Leukocytes, UA: NEGATIVE
Nitrite, UA: NEGATIVE
Spec Grav, UA: 1.025
Urobilinogen, UA: 0.2
pH, UA: 6

## 2012-04-13 LAB — LIPID PANEL
Cholesterol: 187 mg/dL (ref 0–200)
HDL: 62.3 mg/dL (ref >39.00)
LDL Cholesterol: 103 mg/dL — ABNORMAL HIGH (ref 0–99)
Total CHOL/HDL Ratio: 3
Triglycerides: 107 mg/dL (ref 0.0–149.0)
VLDL: 21.4 mg/dL (ref 0.0–40.0)

## 2012-04-13 LAB — HEPATIC FUNCTION PANEL
ALT: 17 U/L (ref 0–35)
AST: 23 U/L (ref 0–37)
Albumin: 4 g/dL (ref 3.5–5.2)
Alkaline Phosphatase: 47 U/L (ref 39–117)
Bilirubin, Direct: 0 mg/dL (ref 0.0–0.3)
Total Bilirubin: 0.5 mg/dL (ref 0.3–1.2)
Total Protein: 7.5 g/dL (ref 6.0–8.3)

## 2012-04-13 LAB — TSH: TSH: 4.71 u[IU]/mL (ref 0.35–5.50)

## 2012-04-14 LAB — CBC WITH DIFFERENTIAL/PLATELET
Basophils Absolute: 0 10*3/uL (ref 0.0–0.1)
Basophils Relative: 0.1 % (ref 0.0–3.0)
Eosinophils Absolute: 0.1 10*3/uL (ref 0.0–0.7)
Eosinophils Relative: 2.8 % (ref 0.0–5.0)
HCT: 40 % (ref 36.0–46.0)
Hemoglobin: 13.1 g/dL (ref 12.0–15.0)
Lymphocytes Relative: 42.3 % (ref 12.0–46.0)
Lymphs Abs: 1.9 10*3/uL (ref 0.7–4.0)
MCHC: 32.9 g/dL (ref 30.0–36.0)
MCV: 84.9 fl (ref 78.0–100.0)
Monocytes Absolute: 0.3 10*3/uL (ref 0.1–1.0)
Monocytes Relative: 5.8 % (ref 3.0–12.0)
Neutro Abs: 2.2 10*3/uL (ref 1.4–7.7)
Neutrophils Relative %: 49 % (ref 43.0–77.0)
Platelets: 195 10*3/uL (ref 150.0–400.0)
RBC: 4.71 Mil/uL (ref 3.87–5.11)
RDW: 14.3 % (ref 11.5–14.6)
WBC: 4.4 10*3/uL — ABNORMAL LOW (ref 4.5–10.5)

## 2012-04-16 ENCOUNTER — Encounter: Payer: BC Managed Care – PPO | Admitting: Family Medicine

## 2012-04-23 ENCOUNTER — Other Ambulatory Visit: Payer: Self-pay | Admitting: Family Medicine

## 2012-04-27 ENCOUNTER — Encounter: Payer: Self-pay | Admitting: Family Medicine

## 2012-04-27 ENCOUNTER — Ambulatory Visit (INDEPENDENT_AMBULATORY_CARE_PROVIDER_SITE_OTHER): Payer: BC Managed Care – PPO | Admitting: Family Medicine

## 2012-04-27 VITALS — BP 130/80 | Temp 98.8°F | Ht 64.0 in | Wt 212.0 lb

## 2012-04-27 DIAGNOSIS — I872 Venous insufficiency (chronic) (peripheral): Secondary | ICD-10-CM

## 2012-04-27 DIAGNOSIS — J309 Allergic rhinitis, unspecified: Secondary | ICD-10-CM

## 2012-04-27 DIAGNOSIS — I10 Essential (primary) hypertension: Secondary | ICD-10-CM

## 2012-04-27 DIAGNOSIS — Z23 Encounter for immunization: Secondary | ICD-10-CM

## 2012-04-27 MED ORDER — FUROSEMIDE 20 MG PO TABS: 20.0000 mg | ORAL_TABLET | ORAL | Status: DC

## 2012-04-27 MED ORDER — POTASSIUM CHLORIDE CRYS ER 20 MEQ PO TBCR: EXTENDED_RELEASE_TABLET | ORAL | Status: DC

## 2012-04-27 MED ORDER — ATENOLOL-CHLORTHALIDONE 50-25 MG PO TABS: ORAL_TABLET | ORAL | Status: DC

## 2012-04-27 NOTE — Progress Notes (Signed)
  Subjective:    Patient ID: Katie Richards, female    DOB: 1955/08/01, 57 y.o.   MRN: [id number]  HPI Sharmel is a delightful 57 year old married female nonsmoker who comes in for general physical examination because of a history of hypertension and venous insufficiency with peripheral edema  She takes Tenoretic dose one half tab every morning BP 130/80  She takes Lasix 20 mg daily along with 40 mEq of potassium daily because of fluid retention secondary to venous insufficiency.  She gets routine eye care, dental care, BSE monthly, and you mammography, colonoscopy in early 50s normal, recent pelvic by Dr. B because of DU B. LMP August hopefully that's the last one    Review of Systems  Constitutional: Negative.   HENT: Negative.   Eyes: Negative.   Respiratory: Negative.   Cardiovascular: Negative.   Gastrointestinal: Negative.   Genitourinary: Negative.   Musculoskeletal: Negative.   Neurological: Negative.   Hematological: Negative.   Psychiatric/Behavioral: Negative.        Objective:   Physical Exam  Constitutional: She appears well-developed and well-nourished.  HENT:  Head: Normocephalic and atraumatic.  Right Ear: External ear normal.  Left Ear: External ear normal.  Nose: Nose normal.  Mouth/Throat: Oropharynx is clear and moist.  Eyes: EOM are normal. Pupils are equal, round, and reactive to light.  Neck: Normal range of motion. Neck supple. No thyromegaly present.  Cardiovascular: Normal rate, regular rhythm, normal heart sounds and intact distal pulses.  Exam reveals no gallop and no friction rub.   No murmur heard. Pulmonary/Chest: Effort normal and breath sounds normal.  Abdominal: Soft. Bowel sounds are normal. She exhibits no distension and no mass. There is no tenderness. There is no rebound.  Genitourinary:       Bilateral breast exam normal  Musculoskeletal: Normal range of motion.  Lymphadenopathy:    She has no cervical adenopathy.  Neurological:  She is alert. She has normal reflexes. No cranial nerve deficit. She exhibits normal muscle tone. Coordination normal.  Skin: Skin is warm and dry.  Psychiatric: She has a normal mood and affect. Her behavior is normal. Judgment and thought content normal.          Assessment & Plan:  Healthy female  Hypertension continue Tenoretic one half tablet daily  Venous insufficiency with peripheral edema continue Lasix 20 mg daily and potassium supplement  Work on diet exercise and weight loss this year  Return in one year sooner if any problems

## 2012-04-27 NOTE — Patient Instructions (Signed)
Continue your current medications  Followup in one year for general physical examination sooner if any problems  Begin a walking program to improve your health and lose weight

## 2012-06-05 ENCOUNTER — Encounter: Payer: Self-pay | Admitting: Family Medicine

## 2012-06-05 ENCOUNTER — Ambulatory Visit (INDEPENDENT_AMBULATORY_CARE_PROVIDER_SITE_OTHER): Payer: BC Managed Care – PPO | Admitting: Family Medicine

## 2012-06-05 VITALS — BP 120/80 | HR 62 | Temp 98.3°F | Wt 215.0 lb

## 2012-06-05 DIAGNOSIS — S39012A Strain of muscle, fascia and tendon of lower back, initial encounter: Secondary | ICD-10-CM

## 2012-06-05 DIAGNOSIS — S335XXA Sprain of ligaments of lumbar spine, initial encounter: Secondary | ICD-10-CM

## 2012-06-05 NOTE — Progress Notes (Signed)
Chief Complaint  Patient presents with  . Back Pain    HPI:  Started 3 days ago: -symptoms: started after spring cleaning and mopping all day the day before - low back pain -she has had some back pain in the past and used hydrocodone -denies: numbness, weakness, fevers, urinary symptoms, bowel or bladder incontinence -worse with standing up, feels better after gets up   ROS: See pertinent positives and negatives per HPI.  Past Medical History  Diagnosis Date  . Hypertension   . DUB (dysfunctional uterine bleeding)   . Uterine fibroid   . History of migraine headaches   . Allergic rhinitis     Family History  Problem Relation Age of Onset  . Alcohol abuse Other   . Hypertension Other   . Cancer Other     lung  . Stroke Other     History   Social History  . Marital Status: Married    Spouse Name: N/A    Number of Children: N/A  . Years of Education: N/A   Social History Main Topics  . Smoking status: Never Smoker   . Smokeless tobacco: None  . Alcohol Use:   . Drug Use:   . Sexually Active:    Other Topics Concern  . None   Social History Narrative  . None    Current outpatient prescriptions:aspirin 81 MG tablet, Take 81 mg by mouth daily.  , Disp: , Rfl: ;  atenolol-chlorthalidone (TENORETIC) 50-25 MG per tablet, Take 1/2 every morning., Disp: 50 tablet, Rfl: 3;  CALCIUM-VITAMIN D PO, Take by mouth daily.  , Disp: , Rfl: ;  furosemide (LASIX) 20 MG tablet, Take 1 tablet (20 mg total) by mouth every morning., Disp: 100 tablet, Rfl: 3 potassium chloride SA (KLOR-CON M20) 20 MEQ tablet, 2 tabs daily, Disp: 200 tablet, Rfl: 3  EXAM:  Filed Vitals:   06/05/12 0808  BP: 120/80  Pulse: 62  Temp: 98.3 F (36.8 C)    Body mass index is 36.89 kg/(m^2).  GENERAL: vitals reviewed and listed above, alert, oriented, appears well hydrated and in no acute distress  HEENT: atraumatic, conjunttiva clear, no obvious abnormalities on inspection of external nose and  ears  NECK: no obvious masses on inspection  Normal Gait Normal inspection of back, no obvious scoliosis or leg length descrepancy No bony TTP Soft tissue TTP at: lumbar paraspinal muscles bilat L >R -/+ tests: neg trendelenburg,-facet loading, -SLRT, -CLRT, -FABER, -FADIR Normal muscle strength, sensation to light touch and DTRs in LEs bilaterally  MS: moves all extremities without noticeable abnormality  PSYCH: pleasant and cooperative, no obvious depression or anxiety  ASSESSMENT AND PLAN:  Discussed the following assessment and plan:  Lumbar strain  -Patient advised to return or notify a doctor immediately if symptoms worsen or new concerns arise.  Patient Instructions  -ibuprofen 400-600mg  twice daily or Tylenol 500-1000mg  up to 3 times per day if needed for pain  -topical sports creams containing menthol or capsaicin are also helpful  -heat for 15 minutes twice daily  Home Exercise Program: 1) start with circled exercises every day for the first 1-2 weeks 2) then add the other exercises  Follow up in 4 weeks     Katie Richards R.

## 2012-06-05 NOTE — Patient Instructions (Addendum)
-  ibuprofen 400-600mg  twice daily or Tylenol 500-1000mg  up to 3 times per day if needed for pain  -topical sports creams containing menthol or capsaicin are also helpful  -heat for 15 minutes twice daily  Home Exercise Program: 1) start with circled exercises every day for the first 1-2 weeks 2) then add the other exercises  Follow up in 4 weeks

## 2012-12-28 ENCOUNTER — Other Ambulatory Visit: Payer: Self-pay | Admitting: Gynecology

## 2013-01-13 ENCOUNTER — Ambulatory Visit (INDEPENDENT_AMBULATORY_CARE_PROVIDER_SITE_OTHER): Payer: BC Managed Care – PPO | Admitting: *Deleted

## 2013-01-13 DIAGNOSIS — Z23 Encounter for immunization: Secondary | ICD-10-CM

## 2013-02-17 ENCOUNTER — Other Ambulatory Visit: Payer: Self-pay

## 2013-02-17 DIAGNOSIS — Z1231 Encounter for screening mammogram for malignant neoplasm of breast: Secondary | ICD-10-CM

## 2013-03-22 ENCOUNTER — Ambulatory Visit
Admission: RE | Admit: 2013-03-22 | Discharge: 2013-03-22 | Disposition: A | Discharge status: Home / Self Care | Payer: BC Managed Care – PPO | Source: Ambulatory Visit

## 2013-03-22 DIAGNOSIS — Z1231 Encounter for screening mammogram for malignant neoplasm of breast: Secondary | ICD-10-CM

## 2013-04-13 ENCOUNTER — Other Ambulatory Visit (INDEPENDENT_AMBULATORY_CARE_PROVIDER_SITE_OTHER): Payer: BC Managed Care – PPO

## 2013-04-13 DIAGNOSIS — Z0000 Encounter for general adult medical examination without abnormal findings: Secondary | ICD-10-CM

## 2013-04-13 LAB — BASIC METABOLIC PANEL
BUN: 20 mg/dL (ref 6–23)
CO2: 26 mEq/L (ref 19–32)
Calcium: 9.4 mg/dL (ref 8.4–10.5)
Chloride: 105 mEq/L (ref 96–112)
Creatinine, Ser: 0.9 mg/dL (ref 0.4–1.2)
GFR: 88.87 mL/min (ref >60.00)
Glucose, Bld: 82 mg/dL (ref 70–99)
Potassium: 3.6 mEq/L (ref 3.5–5.1)
Sodium: 141 mEq/L (ref 135–145)

## 2013-04-13 LAB — HEPATIC FUNCTION PANEL
ALT: 16 U/L (ref 0–35)
AST: 20 U/L (ref 0–37)
Albumin: 4.2 g/dL (ref 3.5–5.2)
Alkaline Phosphatase: 39 U/L (ref 39–117)
Bilirubin, Direct: 0 mg/dL (ref 0.0–0.3)
Total Bilirubin: 0.8 mg/dL (ref 0.3–1.2)
Total Protein: 7.4 g/dL (ref 6.0–8.3)

## 2013-04-13 LAB — POCT URINALYSIS DIPSTICK
Bilirubin, UA: NEGATIVE
Glucose, UA: NEGATIVE
Ketones, UA: NEGATIVE
Leukocytes, UA: NEGATIVE
Nitrite, UA: NEGATIVE
Spec Grav, UA: >=1.03
Urobilinogen, UA: 0.2
pH, UA: 5.5

## 2013-04-13 LAB — LIPID PANEL
Cholesterol: 196 mg/dL (ref 0–200)
HDL: 70.4 mg/dL (ref >39.00)
LDL Cholesterol: 104 mg/dL — ABNORMAL HIGH (ref 0–99)
Total CHOL/HDL Ratio: 3
Triglycerides: 106 mg/dL (ref 0.0–149.0)
VLDL: 21.2 mg/dL (ref 0.0–40.0)

## 2013-04-13 LAB — CBC WITH DIFFERENTIAL/PLATELET
Basophils Absolute: 0 10*3/uL (ref 0.0–0.1)
Basophils Relative: 0.4 % (ref 0.0–3.0)
Eosinophils Absolute: 0.1 10*3/uL (ref 0.0–0.7)
Eosinophils Relative: 2.1 % (ref 0.0–5.0)
HCT: 39 % (ref 36.0–46.0)
Hemoglobin: 12.8 g/dL (ref 12.0–15.0)
Lymphocytes Relative: 38.6 % (ref 12.0–46.0)
Lymphs Abs: 1.7 10*3/uL (ref 0.7–4.0)
MCHC: 32.9 g/dL (ref 30.0–36.0)
MCV: 85.8 fl (ref 78.0–100.0)
Monocytes Absolute: 0.3 10*3/uL (ref 0.1–1.0)
Monocytes Relative: 6.8 % (ref 3.0–12.0)
Neutro Abs: 2.3 10*3/uL (ref 1.4–7.7)
Neutrophils Relative %: 52.1 % (ref 43.0–77.0)
Platelets: 201 10*3/uL (ref 150.0–400.0)
RBC: 4.54 Mil/uL (ref 3.87–5.11)
RDW: 14.6 % (ref 11.5–14.6)
WBC: 4.4 10*3/uL — ABNORMAL LOW (ref 4.5–10.5)

## 2013-04-13 LAB — TSH: TSH: 4.06 u[IU]/mL (ref 0.35–5.50)

## 2013-04-20 ENCOUNTER — Ambulatory Visit (INDEPENDENT_AMBULATORY_CARE_PROVIDER_SITE_OTHER): Payer: BC Managed Care – PPO | Admitting: Family Medicine

## 2013-04-20 ENCOUNTER — Encounter: Payer: Self-pay | Admitting: Family Medicine

## 2013-04-20 VITALS — BP 124/80 | Temp 98.3°F | Ht 63.5 in | Wt 210.0 lb

## 2013-04-20 DIAGNOSIS — I872 Venous insufficiency (chronic) (peripheral): Secondary | ICD-10-CM

## 2013-04-20 DIAGNOSIS — I10 Essential (primary) hypertension: Secondary | ICD-10-CM

## 2013-04-20 DIAGNOSIS — M1990 Unspecified osteoarthritis, unspecified site: Secondary | ICD-10-CM

## 2013-04-20 DIAGNOSIS — J309 Allergic rhinitis, unspecified: Secondary | ICD-10-CM

## 2013-04-20 MED ORDER — FUROSEMIDE 20 MG PO TABS: 20.0000 mg | ORAL_TABLET | ORAL | Status: DC

## 2013-04-20 MED ORDER — ATENOLOL-CHLORTHALIDONE 50-25 MG PO TABS: ORAL_TABLET | ORAL | Status: DC

## 2013-04-20 MED ORDER — POTASSIUM CHLORIDE CRYS ER 20 MEQ PO TBCR: EXTENDED_RELEASE_TABLET | ORAL | Status: DC

## 2013-04-20 NOTE — Progress Notes (Signed)
   Subjective:    Patient ID: Katie Richards, female    DOB: 1956-02-24, 58 y.o.   MRN: [id number]  HPI Katie Richards is a 58 year old married female nonsmoker who comes in today for general physical examination because of a history of hypertension venous insufficiency slightly overweight and plantar fasciitis  She takes Tenoretic dose one half tab daily for hypertension BP 124/80  For the venous insufficiency she takes Lasix 20 mg daily along with a to potassium supplements.  Shows a 4 year history of plantar fasciitis unresponsive to everything including physical therapy.  She takes Celebrex when necessary for pain in her knees. 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 She's been seen by orthopedics  She gets routine eye care, dental care, BSE monthly, and you mammography, last colonoscopy normal, vaccinations up-to-date is   Review of Systems  Constitutional: Negative.   HENT: Negative.   Eyes: Negative.   Respiratory: Negative.   Cardiovascular: Negative.   Gastrointestinal: Negative.   Endocrine: Negative.   Genitourinary: Negative.   Musculoskeletal: Negative.   Allergic/Immunologic: Negative.   Neurological: Negative.   Hematological: Negative.   Psychiatric/Behavioral: Negative.        Objective:   Physical Exam  Nursing note and vitals reviewed. Constitutional: She appears well-developed and well-nourished.  HENT:  Head: Normocephalic and atraumatic.  Right Ear: External ear normal.  Left Ear: External ear normal.  Nose: Nose normal.  Mouth/Throat: Oropharynx is clear and moist.  Eyes: EOM are normal. Pupils are equal, round, and reactive to light.  Neck: Normal range of motion. Neck supple. No thyromegaly present.  Cardiovascular: Normal rate, regular rhythm, normal heart sounds and intact distal pulses.  Exam reveals no gallop and no friction rub.   No murmur heard. No carotid or aortic bruits peripheral pulses 2+ and symmetrical  Pulmonary/Chest:  Effort normal and breath sounds normal.  Abdominal: Soft. Bowel sounds are normal. She exhibits no distension and no mass. There is no tenderness. There is no rebound.  Genitourinary:  Bilateral breast exam normal  Musculoskeletal: Normal range of motion.  Lymphadenopathy:    She has no cervical adenopathy.  Neurological: She is alert. She has normal reflexes. No cranial nerve deficit. She exhibits normal muscle tone. Coordination normal.  Skin: Skin is warm and dry.  Total body skin exam normal  Psychiatric: She has a normal mood and affect. Her behavior is normal. Judgment and thought content normal.          Assessment & Plan:  Healthy female  Hypertension continue Tenoretic  Venous insufficiency continue Lasix and potassium  Overweight again anch[place] diet exercise and weight loss  Degenerative joint disease both knees Celebrex when necessary  Plantar fasciitis continue conservative home therapy

## 2013-05-17 ENCOUNTER — Telehealth: Payer: Self-pay | Admitting: Family Medicine

## 2013-05-17 NOTE — Telephone Encounter (Signed)
Relevant patient education mailed to patient.  

## 2013-12-24 ENCOUNTER — Ambulatory Visit (INDEPENDENT_AMBULATORY_CARE_PROVIDER_SITE_OTHER): Payer: BC Managed Care – PPO

## 2013-12-24 DIAGNOSIS — Z23 Encounter for immunization: Secondary | ICD-10-CM

## 2013-12-30 ENCOUNTER — Other Ambulatory Visit: Payer: Self-pay | Admitting: Gynecology

## 2013-12-31 LAB — CYTOLOGY - PAP

## 2014-02-03 ENCOUNTER — Other Ambulatory Visit: Payer: Self-pay

## 2014-02-03 DIAGNOSIS — Z1231 Encounter for screening mammogram for malignant neoplasm of breast: Secondary | ICD-10-CM

## 2014-03-24 ENCOUNTER — Ambulatory Visit
Admission: RE | Admit: 2014-03-24 | Discharge: 2014-03-24 | Disposition: A | Discharge status: Home / Self Care | Payer: BC Managed Care – PPO | Source: Ambulatory Visit

## 2014-03-24 DIAGNOSIS — Z1231 Encounter for screening mammogram for malignant neoplasm of breast: Secondary | ICD-10-CM

## 2014-03-28 ENCOUNTER — Emergency Department (HOSPITAL_COMMUNITY)
Admission: EM | Admit: 2014-03-28 | Discharge: 2014-03-28 | Disposition: A | Discharge status: Home / Self Care | Payer: BC Managed Care – PPO | Attending: Emergency Medicine | Admitting: Emergency Medicine

## 2014-03-28 ENCOUNTER — Emergency Department (HOSPITAL_COMMUNITY): Payer: BC Managed Care – PPO

## 2014-03-28 ENCOUNTER — Encounter (HOSPITAL_COMMUNITY): Payer: Self-pay | Admitting: Emergency Medicine

## 2014-03-28 DIAGNOSIS — Z8709 Personal history of other diseases of the respiratory system: Secondary | ICD-10-CM | POA: Insufficient documentation

## 2014-03-28 DIAGNOSIS — Z79899 Other long term (current) drug therapy: Secondary | ICD-10-CM | POA: Diagnosis not present

## 2014-03-28 DIAGNOSIS — R0789 Other chest pain: Secondary | ICD-10-CM | POA: Diagnosis not present

## 2014-03-28 DIAGNOSIS — Z791 Long term (current) use of non-steroidal anti-inflammatories (NSAID): Secondary | ICD-10-CM | POA: Insufficient documentation

## 2014-03-28 DIAGNOSIS — R079 Chest pain, unspecified: Secondary | ICD-10-CM | POA: Diagnosis present

## 2014-03-28 DIAGNOSIS — I10 Essential (primary) hypertension: Secondary | ICD-10-CM | POA: Insufficient documentation

## 2014-03-28 DIAGNOSIS — Z8742 Personal history of other diseases of the female genital tract: Secondary | ICD-10-CM | POA: Insufficient documentation

## 2014-03-28 LAB — LIPASE, BLOOD: Lipase: 32 U/L (ref 11–59)

## 2014-03-28 LAB — COMPREHENSIVE METABOLIC PANEL
ALT: 17 U/L (ref 0–35)
AST: 21 U/L (ref 0–37)
Albumin: 3.7 g/dL (ref 3.5–5.2)
Alkaline Phosphatase: 49 U/L (ref 39–117)
Anion gap: 15 (ref 5–15)
BUN: 18 mg/dL (ref 6–23)
CO2: 23 mEq/L (ref 19–32)
Calcium: 9.7 mg/dL (ref 8.4–10.5)
Chloride: 101 mEq/L (ref 96–112)
Creatinine, Ser: 0.83 mg/dL (ref 0.50–1.10)
GFR calc Af Amer: 89 mL/min — ABNORMAL LOW (ref >90)
GFR calc non Af Amer: 77 mL/min — ABNORMAL LOW (ref >90)
Glucose, Bld: 94 mg/dL (ref 70–99)
Potassium: 3.7 mEq/L (ref 3.7–5.3)
Sodium: 139 mEq/L (ref 137–147)
Total Bilirubin: 0.3 mg/dL (ref 0.3–1.2)
Total Protein: 7.2 g/dL (ref 6.0–8.3)

## 2014-03-28 LAB — CBC
HCT: 38.5 % (ref 36.0–46.0)
Hemoglobin: 12.4 g/dL (ref 12.0–15.0)
MCH: 27.9 pg (ref 26.0–34.0)
MCHC: 32.2 g/dL (ref 30.0–36.0)
MCV: 86.5 fL (ref 78.0–100.0)
Platelets: 198 10*3/uL (ref 150–400)
RBC: 4.45 MIL/uL (ref 3.87–5.11)
RDW: 14.6 % (ref 11.5–15.5)
WBC: 4.9 10*3/uL (ref 4.0–10.5)

## 2014-03-28 LAB — I-STAT TROPONIN, ED: Troponin i, poc: 0 ng/mL (ref 0.00–0.08)

## 2014-03-28 LAB — PRO B NATRIURETIC PEPTIDE: Pro B Natriuretic peptide (BNP): 205.1 pg/mL — ABNORMAL HIGH (ref 0–125)

## 2014-03-28 LAB — TROPONIN I
Troponin I: <0.3 ng/mL (ref <0.30)
Troponin I: <0.3 ng/mL (ref <0.30)

## 2014-03-28 MED ORDER — GI COCKTAIL ~~LOC~~
30.0000 mL | Freq: Once | ORAL | Status: AC
  Administered 2014-03-28: 30 mL via ORAL
  Filled 2014-03-28: qty 30

## 2014-03-28 MED ORDER — ASPIRIN 325 MG PO TABS
325.0000 mg | ORAL_TABLET | Freq: Once | ORAL | Status: AC
  Administered 2014-03-28: 325 mg via ORAL
  Filled 2014-03-28: qty 1

## 2014-03-28 NOTE — ED Provider Notes (Signed)
Care transferred to me. 2nd trop negative, remains pain free. D/C home with cardiology follow up as per Dr. Burman Foster, MD 03/28/14 [phone]

## 2014-03-28 NOTE — ED Provider Notes (Signed)
CSN: [id number]     Arrival date & time 03/28/14  1253 History   First MD Initiated Contact with Patient 03/28/14 1255     Chief Complaint  Patient presents with  . Chest Pain     (Consider location/radiation/quality/duration/timing/severity/associated sxs/prior Treatment) Patient is a 58 y.o. female presenting with chest pain.  Chest Pain Pain location:  Substernal area Pain quality: sharp   Pain radiates to:  Does not radiate Pain radiates to the back: no   Pain severity:  Moderate Onset quality:  Gradual Duration:  30 minutes Timing:  Constant Progression:  Worsening Chronicity:  New Context: breathing and at rest   Relieved by:  Nothing Worsened by:  Nothing tried Ineffective treatments:  None tried Associated symptoms: no abdominal pain, no cough, no diaphoresis, no fever, no nausea and not vomiting     Past Medical History  Diagnosis Date  . Hypertension   . DUB (dysfunctional uterine bleeding)   . Uterine fibroid   . History of migraine headaches   . Allergic rhinitis    Past Surgical History  Procedure Laterality Date  . Childbirth      x2  . Reverese tubal ligation    . Abdominal hysterectomy     Family History  Problem Relation Age of Onset  . Alcohol abuse Other   . Hypertension Other   . Cancer Other     lung  . Stroke Other    History  Substance Use Topics  . Smoking status: Never Smoker   . Smokeless tobacco: Not on file  . Alcohol Use: Not on file   OB History    No data available     Review of Systems  Constitutional: Negative for fever and diaphoresis.  Respiratory: Negative for cough.   Cardiovascular: Positive for chest pain.  Gastrointestinal: Negative for nausea, vomiting and abdominal pain.  All other systems reviewed and are negative.     Allergies  Sulfonamide derivatives  Home Medications   Prior to Admission medications   Medication Sig Start Date End Date Taking? Authorizing Provider  aspirin 81 MG tablet Take  81 mg by mouth every Monday, Wednesday, and Friday.    Yes Historical Provider, MD  atenolol-chlorthalidone (TENORETIC) 50-25 MG per tablet Take 1/2 every morning. Patient taking differently: Take 0.5 tablets by mouth daily.  04/20/13  Yes Dorena Cookey, MD  CALCIUM-VITAMIN D PO Take 1 tablet by mouth daily.    Yes Historical Provider, MD  celecoxib (CELEBREX) 100 MG capsule Take 100 mg by mouth 2 (two) times daily as needed for mild pain.    Yes Historical Provider, MD  furosemide (LASIX) 20 MG tablet Take 1 tablet (20 mg total) by mouth every morning. 04/20/13  Yes Dorena Cookey, MD  potassium chloride SA (KLOR-CON M20) 20 MEQ tablet 2 tabs daily Patient taking differently: Take 40 mEq by mouth daily.  04/20/13  Yes Dorena Cookey, MD   BP 117/69 mmHg  Pulse 53  Temp(Src) 97.9 F (36.6 C) (Oral)  Resp 14  SpO2 99% Physical Exam  Constitutional: She is oriented to person, place, and time. She appears well-developed and well-nourished.  HENT:  Head: Normocephalic and atraumatic.  Right Ear: External ear normal.  Left Ear: External ear normal.  Eyes: Conjunctivae and EOM are normal. Pupils are equal, round, and reactive to light.  Neck: Normal range of motion. Neck supple.  Cardiovascular: Normal rate, regular rhythm, normal heart sounds and intact distal pulses.   Pulmonary/Chest: Effort normal  and breath sounds normal. She exhibits tenderness.  Abdominal: Soft. Bowel sounds are normal. There is no tenderness.  Musculoskeletal: Normal range of motion.  Neurological: She is alert and oriented to person, place, and time.  Skin: Skin is warm and dry.  Vitals reviewed.   ED Course  Procedures (including critical care time) Labs Review Labs Reviewed  PRO B NATRIURETIC PEPTIDE - Abnormal; Notable for the following:    Pro B Natriuretic peptide (BNP) 205.1 (*)    All other components within normal limits  COMPREHENSIVE METABOLIC PANEL - Abnormal; Notable for the following:    GFR calc  non Af Amer 77 (*)    GFR calc Af Amer 89 (*)    All other components within normal limits  CBC  LIPASE, BLOOD  TROPONIN I  TROPONIN I  I-STAT TROPOININ, ED    Imaging Review Dg Chest 2 View  03/28/2014   CLINICAL DATA:  Initial encounter for mid anterior chest pain that began 2 hrs ago.  EXAM: CHEST  2 VIEW  COMPARISON:  None.  FINDINGS: The heart size and mediastinal contours are within normal limits. Both lungs are clear. The visualized skeletal structures are unremarkable.  IMPRESSION: No active cardiopulmonary disease.   Electronically Signed   By: Misty Stanley M.D.   On: 03/28/2014 13:56     EKG Interpretation   Date/Time:  Monday March 28 2014 13:04:58 EST Ventricular Rate:  67 PR Interval:  157 QRS Duration: 85 QT Interval:  568 QTC Calculation: 600 R Axis:   43 Text Interpretation:  Sinus rhythm Borderline T abnormalities, inferior  leads Prolonged QT interval Baseline wander in lead(s) II aVF V6 SINCE  LAST TRACING HEART RATE HAS INCREASED Confirmed by Debby Freiberg [phone])  on 03/28/2014 1:25:11 PM      MDM   Final diagnoses:  Chest pain    58 y.o. female with pertinent PMH of HTN presents with chest pain as described above.  Symptoms atypical for ACS, and pt has chest wall tenderness.  She has a ho somewhat similar symptoms with reflux, however states that symptoms were mildly worse today.  On arrival pt complains of the pain radiating up, not to either shoulder.  HEART score 2.  ECG unchanged.  Trop negative.  Symptoms relieved with gi cocktail.  On repeat examination, pt is chest pain free.    BNP mildly elevated, but pt is not dyspneic, and has a ho bil pedal edema for which she takes lasix.  Pt care to Dr. Regenia Skeeter pending repeat troponin.  1. Chest pain         Debby Freiberg, MD 03/28/14 [phone]

## 2014-03-28 NOTE — ED Notes (Signed)
Pt c/o mid chest pain and SOB x 30 mins. Pt states feels like indigestion but not sure. Pt denies n/v/d or headache.

## 2014-03-28 NOTE — ED Notes (Signed)
Will get blood when patient returns back from [facility]

## 2014-03-28 NOTE — Discharge Instructions (Signed)
Chest Pain (Nonspecific) °It is often hard to give a specific diagnosis for the cause of chest pain. There is always a chance that your pain could be related to something serious, such as a heart attack or a blood clot in the lungs. You need to follow up with your health care provider for further evaluation. °CAUSES  °· Heartburn. °· Pneumonia or bronchitis. °· Anxiety or stress. °· Inflammation around your heart (pericarditis) or lung (pleuritis or pleurisy). °· A blood clot in the lung. °· A collapsed lung (pneumothorax). It can develop suddenly on its own (spontaneous pneumothorax) or from trauma to the chest. °· Shingles infection (herpes zoster virus). °The chest wall is composed of bones, muscles, and cartilage. Any of these can be the source of the pain. °· The bones can be bruised by injury. °· The muscles or cartilage can be strained by coughing or overwork. °· The cartilage can be affected by inflammation and become sore (costochondritis). °DIAGNOSIS  °Lab tests or other studies may be needed to find the cause of your pain. Your health care provider may have you take a test called an ambulatory electrocardiogram (ECG). An ECG records your heartbeat patterns over a 24-hour period. You may also have other tests, such as: °· Transthoracic echocardiogram (TTE). During echocardiography, sound waves are used to evaluate how blood flows through your heart. °· Transesophageal echocardiogram (TEE). °· Cardiac monitoring. This allows your health care provider to monitor your heart rate and rhythm in real time. °· Holter monitor. This is a portable device that records your heartbeat and can help diagnose heart arrhythmias. It allows your health care provider to track your heart activity for several days, if needed. °· Stress tests by exercise or by giving medicine that makes the heart beat faster. °TREATMENT  °· Treatment depends on what may be causing your chest pain. Treatment may include: °· Acid blockers for  heartburn. °· Anti-inflammatory medicine. °· Pain medicine for inflammatory conditions. °· Antibiotics if an infection is present. °· You may be advised to change lifestyle habits. This includes stopping smoking and avoiding alcohol, caffeine, and chocolate. °· You may be advised to keep your head raised (elevated) when sleeping. This reduces the chance of acid going backward from your stomach into your esophagus. °Most of the time, nonspecific chest pain will improve within 2-3 days with rest and mild pain medicine.  °HOME CARE INSTRUCTIONS  °· If antibiotics were prescribed, take them as directed. Finish them even if you start to feel better. °· For the next few days, avoid physical activities that bring on chest pain. Continue physical activities as directed. °· Do not use any tobacco products, including cigarettes, chewing tobacco, or electronic cigarettes. °· Avoid drinking alcohol. °· Only take medicine as directed by your health care provider. °· Follow your health care provider's suggestions for further testing if your chest pain does not go away. °· Keep any follow-up appointments you made. If you do not go to an appointment, you could develop lasting (chronic) problems with pain. If there is any problem keeping an appointment, call to reschedule. °SEEK MEDICAL CARE IF:  °· Your chest pain does not go away, even after treatment. °· You have a rash with blisters on your chest. °· You have a fever. °SEEK IMMEDIATE MEDICAL CARE IF:  °· You have increased chest pain or pain that spreads to your arm, neck, jaw, back, or abdomen. °· You have shortness of breath. °· You have an increasing cough, or you cough   up blood. °· You have severe back or abdominal pain. °· You feel nauseous or vomit. °· You have severe weakness. °· You faint. °· You have chills. °This is an emergency. Do not wait to see if the pain will go away. Get medical help at once. Call your local emergency services (911 in [place] Do not drive  yourself to the hospital. °MAKE SURE YOU:  °· Understand these instructions. °· Will watch your condition. °· Will get help right away if you are not doing well or get worse. °Document Released: 01/09/2005 Document Revised: 04/06/2013 Document Reviewed: 11/05/2007 °ExitCare® Patient Information ©2015 [employer]. This information is not intended to replace advice given to you by your health care provider. Make sure you discuss any questions you have with your health care provider. °Gastroesophageal Reflux Disease, Adult °Gastroesophageal reflux disease (GERD) happens when acid from your stomach flows up into the esophagus. When acid comes in contact with the esophagus, the acid causes soreness (inflammation) in the esophagus. Over time, GERD may create small holes (ulcers) in the lining of the esophagus. °CAUSES  °· Increased body weight. This puts pressure on the stomach, making acid rise from the stomach into the esophagus. °· Smoking. This increases acid production in the stomach. °· Drinking alcohol. This causes decreased pressure in the lower esophageal sphincter (valve or ring of muscle between the esophagus and stomach), allowing acid from the stomach into the esophagus. °· Late evening meals and a full stomach. This increases pressure and acid production in the stomach. °· A malformed lower esophageal sphincter. °Sometimes, no cause is found. °SYMPTOMS  °· Burning pain in the lower part of the mid-chest behind the breastbone and in the mid-stomach area. This may occur twice a week or more often. °· Trouble swallowing. °· Sore throat. °· Dry cough. °· Asthma-like symptoms including chest tightness, shortness of breath, or wheezing. °DIAGNOSIS  °Your caregiver may be able to diagnose GERD based on your symptoms. In some cases, X-rays and other tests may be done to check for complications or to check the condition of your stomach and esophagus. °TREATMENT  °Your caregiver may recommend over-the-counter or  prescription medicines to help decrease acid production. Ask your caregiver before starting or adding any new medicines.  °HOME CARE INSTRUCTIONS  °· Change the factors that you can control. Ask your caregiver for guidance concerning weight loss, quitting smoking, and alcohol consumption. °· Avoid foods and drinks that make your symptoms worse, such as: °¨ Caffeine or alcoholic drinks. °¨ Chocolate. °¨ Peppermint or mint flavorings. °¨ Garlic and onions. °¨ Spicy foods. °¨ Citrus fruits, such as oranges, lemons, or limes. °¨ Tomato-based foods such as sauce, chili, salsa, and pizza. °¨ Fried and fatty foods. °· Avoid lying down for the 3 hours prior to your bedtime or prior to taking a nap. °· Eat small, frequent meals instead of large meals. °· Wear loose-fitting clothing. Do not wear anything tight around your waist that causes pressure on your stomach. °· Raise the head of your bed 6 to 8 inches with wood blocks to help you sleep. Extra pillows will not help. °· Only take over-the-counter or prescription medicines for pain, discomfort, or fever as directed by your caregiver. °· Do not take aspirin, ibuprofen, or other nonsteroidal anti-inflammatory drugs (NSAIDs). °SEEK IMMEDIATE MEDICAL CARE IF:  °· You have pain in your arms, neck, jaw, teeth, or back. °· Your pain increases or changes in intensity or duration. °· You develop nausea, vomiting, or sweating (diaphoresis). °·   You develop shortness of breath, or you faint. °· Your vomit is green, yellow, black, or looks like coffee grounds or blood. °· Your stool is red, bloody, or black. °These symptoms could be signs of other problems, such as heart disease, gastric bleeding, or esophageal bleeding. °MAKE SURE YOU:  °· Understand these instructions. °· Will watch your condition. °· Will get help right away if you are not doing well or get worse. °Document Released: 01/09/2005 Document Revised: 06/24/2011 Document Reviewed: 10/19/2010 °ExitCare® Patient  Information ©2015 [employer]. This information is not intended to replace advice given to you by your health care provider. Make sure you discuss any questions you have with your health care provider. ° °

## 2014-03-29 NOTE — Progress Notes (Signed)
Patient ID: Katie Richards, female   DOB: [birth year], 58 y.o.   MRN: [id number]    58 yo referred by [facility] for chest pain seen there 12/14 /15 Pertinent PMH of HTN Symptoms atypical for ACS, Pt has chest wall tenderness. She has a ho somewhat similar symptoms with reflux, however states that symptoms were mildly worse . On arrival pt complained of the pain radiating up, not to either shoulder. HEART score 2. ECG unchanged. Trop negative. Symptoms relieved with gi cocktail. On repeat examination, pt is chest pain free. BNP mildly elevated 205 , but pt is not dyspneic, and has a ho bil pedal edema for which she takes lasix.  Not clear why she is on both HCTC/Lasix should simplify Needs Mg checked with QT up.  Is not taking PPI.  No high risk family history One of 10 kids ETOH abuse familial But no sudden death, syncope HOCM of long QT    ROS: Denies fever, malais, weight loss, blurry vision, decreased visual acuity, cough, sputum, SOB, hemoptysis, pleuritic pain, palpitaitons, heartburn, abdominal pain, melena, lower extremity edema, claudication, or rash.  All other systems reviewed and negative   General: Affect appropriate Overweight black female HEENT: normal Neck supple with no adenopathy JVP normal no bruits no thyromegaly Lungs clear with no wheezing and good diaphragmatic motion Heart:  S1/S2 no murmur,rub, gallop or click PMI normal Abdomen: benighn, BS positve, no tenderness, no AAA no bruit.  No HSM or HJR Distal pulses intact with no bruits Trace bilateral  edema Neuro non-focal Skin warm and dry No muscular weakness  Medications Current Outpatient Prescriptions  Medication Sig Dispense Refill  . aspirin 81 MG tablet Take 81 mg by mouth every Monday, Wednesday, and Friday.     Marland Kitchen atenolol-chlorthalidone (TENORETIC) 50-25 MG per tablet Take 1/2 every morning. (Patient taking differently: Take 0.5 tablets by mouth daily. ) 50 tablet 3  . CALCIUM-VITAMIN D PO  Take 1 tablet by mouth daily.     . celecoxib (CELEBREX) 100 MG capsule Take 100 mg by mouth 2 (two) times daily as needed for mild pain.     . furosemide (LASIX) 20 MG tablet Take 1 tablet (20 mg total) by mouth every morning. 100 tablet 3  . potassium chloride SA (KLOR-CON M20) 20 MEQ tablet 2 tabs daily (Patient taking differently: Take 40 mEq by mouth daily. ) 200 tablet 3   No current facility-administered medications for this visit.    Allergies Sulfonamide derivatives  Family History: Family History  Problem Relation Age of Onset  . Alcohol abuse Other   . Hypertension Other   . Cancer Other     lung  . Stroke Other     Social History: History   Social History  . Marital Status: Married    Spouse Name: N/A    Number of Children: N/A  . Years of Education: N/A   Occupational History  . Not on file.   Social History Main Topics  . Smoking status: Never Smoker   . Smokeless tobacco: Not on file  . Alcohol Use: Not on file  . Drug Use: Not on file  . Sexual Activity: Not on file   Other Topics Concern  . Not on file   Social History Narrative    Past Surgical History  Procedure Laterality Date  . Childbirth      x2  . Reverese tubal ligation    . Abdominal hysterectomy      Past Medical History  Diagnosis Date  . Hypertension   . DUB (dysfunctional uterine bleeding)   . Uterine fibroid   . History of migraine headaches   . Allergic rhinitis     Electrocardiogram:  12/15  SR rate 67 nonspecific ST/T wave changes T wave indistinct and QT read as 568  1/15  SR rate 53 nonspecific ST changes QT 448  Assessment and Plan

## 2014-03-30 ENCOUNTER — Ambulatory Visit (INDEPENDENT_AMBULATORY_CARE_PROVIDER_SITE_OTHER): Payer: BC Managed Care – PPO | Admitting: Cardiovascular Disease

## 2014-03-30 ENCOUNTER — Encounter: Payer: Self-pay | Admitting: Cardiovascular Disease

## 2014-03-30 VITALS — BP 122/70 | HR 65 | Ht 63.0 in | Wt 211.0 lb

## 2014-03-30 DIAGNOSIS — R079 Chest pain, unspecified: Secondary | ICD-10-CM | POA: Insufficient documentation

## 2014-03-30 DIAGNOSIS — R9431 Abnormal electrocardiogram [ECG] [EKG]: Secondary | ICD-10-CM

## 2014-03-30 DIAGNOSIS — I10 Essential (primary) hypertension: Secondary | ICD-10-CM

## 2014-03-30 DIAGNOSIS — I872 Venous insufficiency (chronic) (peripheral): Secondary | ICD-10-CM

## 2014-03-30 DIAGNOSIS — R072 Precordial pain: Secondary | ICD-10-CM

## 2014-03-30 LAB — BASIC METABOLIC PANEL
BUN: 15 mg/dL (ref 6–23)
CO2: 26 mEq/L (ref 19–32)
Calcium: 9.4 mg/dL (ref 8.4–10.5)
Chloride: 106 mEq/L (ref 96–112)
Creatinine, Ser: 0.9 mg/dL (ref 0.4–1.2)
GFR: 86.22 mL/min (ref >60.00)
Glucose, Bld: 97 mg/dL (ref 70–99)
Potassium: 4.2 mEq/L (ref 3.5–5.1)
Sodium: 142 mEq/L (ref 135–145)

## 2014-03-30 LAB — MAGNESIUM: Magnesium: 2.2 mg/dL (ref 1.5–2.5)

## 2014-03-30 MED ORDER — ATENOLOL 25 MG PO TABS: 25.0000 mg | ORAL_TABLET | Freq: Every day | ORAL | Status: DC

## 2014-03-30 NOTE — Assessment & Plan Note (Signed)
Well controlled.  Continue current medications and low sodium Dash type diet.    

## 2014-03-30 NOTE — Assessment & Plan Note (Signed)
Atypical nonspecific ECG changes f/u stress myovue

## 2014-03-30 NOTE — Assessment & Plan Note (Signed)
Check BMET/Mg wit hlong QT  D/c HCTC  Use lasix and escalate dose if needed

## 2014-03-30 NOTE — Patient Instructions (Addendum)
Your physician recommends that you schedule a follow-up appointment in: NEXT AVAILABLE WITH  DR [facility] Your physician has recommended you make the following change in your medication:  STOP ATENOLOL/CHLORTHALIDONE START ATENOLOL  50 MG  Your physician recommends that you return for lab work in:  [place] has requested that you have en exercise stress myoview. For further information please visit [email or web address]. Please follow instruction sheet, as given.

## 2014-04-04 ENCOUNTER — Telehealth: Payer: Self-pay | Admitting: Cardiovascular Disease

## 2014-04-04 NOTE — Telephone Encounter (Signed)
PT AWARE OF LAB RESULTS./CY 

## 2014-04-04 NOTE — Telephone Encounter (Signed)
New Msg    Pt returning call from Friday.    Please call back at [phone].

## 2014-04-12 ENCOUNTER — Ambulatory Visit (HOSPITAL_COMMUNITY): Payer: BC Managed Care – PPO | Attending: Cardiovascular Disease | Admitting: Radiology

## 2014-04-12 DIAGNOSIS — I10 Essential (primary) hypertension: Secondary | ICD-10-CM | POA: Insufficient documentation

## 2014-04-12 DIAGNOSIS — R079 Chest pain, unspecified: Secondary | ICD-10-CM

## 2014-04-12 DIAGNOSIS — R9431 Abnormal electrocardiogram [ECG] [EKG]: Secondary | ICD-10-CM

## 2014-04-12 DIAGNOSIS — R0602 Shortness of breath: Secondary | ICD-10-CM | POA: Insufficient documentation

## 2014-04-12 MED ORDER — TECHNETIUM TC 99M SESTAMIBI GENERIC - CARDIOLITE
30.0000 | Freq: Once | INTRAVENOUS | Status: AC | PRN
  Administered 2014-04-12: 30 via INTRAVENOUS

## 2014-04-12 MED ORDER — TECHNETIUM TC 99M SESTAMIBI GENERIC - CARDIOLITE
10.0000 | Freq: Once | INTRAVENOUS | Status: AC | PRN
  Administered 2014-04-12: 10 via INTRAVENOUS

## 2014-04-12 NOTE — Progress Notes (Signed)
[facility] [address] [place], [place] [address] [phone]    Cardiology Nuclear Med Study  DENNA FRYBERGER is a 58 y.o. female     MRN : [id number]     DOB: 1955-05-13  Procedure Date: 04/12/2014  Nuclear Med Background Indication for Stress Test:  Evaluation for Ischemia, Abnormal EKG, and Patient seen in hospital on 03-28-2014 for Chest pain, Enzymes negative History:  Asthma  Cardiac Risk Factors: Hypertension  Symptoms:  Chest Pain, DOE and SOB   Nuclear Pre-Procedure Caffeine/Decaff Intake:  None> 12 hrs NPO After: 7:30pm   Lungs:  clear O2 Sat: 96% on room air. IV 0.9% NS with Angio Cath:  22g  IV Site: R Forearm x 1, tolerated well IV Started by:  Irven Baltimore, RN  Chest Size (in):  38 Cup Size: C  Height: 5\' 3"  (1.6 m)  Weight:  207 lb (93.895 kg)  BMI:  Body mass index is 36.68 kg/(m^2). Tech Comments:  Patient held Atenolol x 24 hrs. Irven Baltimore, RN.    Nuclear Med Study 1 or 2 day study: 1 day  Stress Test Type:  Stress  Reading MD: N/A  Order Authorizing Provider:  Jenkins Rouge, MD  Resting Radionuclide: Technetium 94m Sestamibi  Resting Radionuclide Dose: 11.0 mCi   Stress Radionuclide:  Technetium 22m Sestamibi  Stress Radionuclide Dose: 33.0 mCi           Stress Protocol Rest HR: 58 Stress HR: 151  Rest BP: 148/89 Stress BP: 220/103  Exercise Time (min): 6:30 METS: 7.70   Predicted Max HR: 163 bpm % Max HR: 92.64 bpm Rate Pressure Product: 33220   Dose of Adenosine (mg):  n/a Dose of Lexiscan: n/a mg  Dose of Atropine (mg): n/a Dose of Dobutamine: n/a mcg/kg/min (at max HR)  Stress Test Technologist: Perrin Maltese, EMT-P  Nuclear Technologist:  Earl Many, CNMT     Rest Procedure:  Myocardial perfusion imaging was performed at rest 45 minutes following the intravenous administration of Technetium 13m Sestamibi. Rest ECG: NSR - Normal EKG  Stress Procedure:  The patient exercised on the treadmill  utilizing the Bruce Protocol for 6:30 minutes. The patient stopped due to sob, fatigue, and denied any chest pain.  Technetium 13m Sestamibi was injected at peak exercise and myocardial perfusion imaging was performed after a brief delay. Stress ECG: Insignificant upsloping ST segment depression.  QPS Raw Data Images:  Normal; no motion artifact; normal heart/lung ratio. Stress Images:  Normal homogeneous uptake in all areas of the myocardium. Rest Images:  Normal homogeneous uptake in all areas of the myocardium. Subtraction (SDS):  No evidence of ischemia. Transient Ischemic Dilatation (Normal <1.22):  0.96 Lung/Heart Ratio (Normal <0.45):  0.33  Quantitative Gated Spect Images QGS EDV:  88 ml QGS ESV:  27 ml  Impression Exercise Capacity:  Good exercise capacity. BP Response:  Hypertensive blood pressure response. Clinical Symptoms:  No chest pain. ECG Impression:  Insignificant upsloping ST segment depression. Comparison with Prior Nuclear Study: No previous nuclear study performed  Overall Impression:  Low risk stress nuclear study.   No ischemia. Hypertensive response to exercise. Normal LV function.  LV Ejection Fraction: 70%.  LV Wall Motion:  NL LV Function; NL Wall Motion  Darlin Coco MD

## 2014-04-21 ENCOUNTER — Ambulatory Visit: Payer: BC Managed Care – PPO | Admitting: Cardiovascular Disease

## 2014-04-28 ENCOUNTER — Other Ambulatory Visit (INDEPENDENT_AMBULATORY_CARE_PROVIDER_SITE_OTHER): Payer: BLUE CROSS/BLUE SHIELD

## 2014-04-28 DIAGNOSIS — Z0000 Encounter for general adult medical examination without abnormal findings: Secondary | ICD-10-CM

## 2014-04-28 LAB — CBC WITH DIFFERENTIAL/PLATELET
Basophils Absolute: 0 10*3/uL (ref 0.0–0.1)
Basophils Relative: 0.3 % (ref 0.0–3.0)
Eosinophils Absolute: 0.1 10*3/uL (ref 0.0–0.7)
Eosinophils Relative: 2.4 % (ref 0.0–5.0)
HCT: 40.1 % (ref 36.0–46.0)
Hemoglobin: 12.9 g/dL (ref 12.0–15.0)
Lymphocytes Relative: 34 % (ref 12.0–46.0)
Lymphs Abs: 1.5 10*3/uL (ref 0.7–4.0)
MCHC: 32.2 g/dL (ref 30.0–36.0)
MCV: 86 fl (ref 78.0–100.0)
Monocytes Absolute: 0.3 10*3/uL (ref 0.1–1.0)
Monocytes Relative: 6.7 % (ref 3.0–12.0)
Neutro Abs: 2.5 10*3/uL (ref 1.4–7.7)
Neutrophils Relative %: 56.6 % (ref 43.0–77.0)
Platelets: 184 10*3/uL (ref 150.0–400.0)
RBC: 4.66 Mil/uL (ref 3.87–5.11)
RDW: 14.3 % (ref 11.5–15.5)
WBC: 4.4 10*3/uL (ref 4.0–10.5)

## 2014-04-28 LAB — COMPREHENSIVE METABOLIC PANEL
ALT: 16 U/L (ref 0–35)
AST: 19 U/L (ref 0–37)
Albumin: 4.2 g/dL (ref 3.5–5.2)
Alkaline Phosphatase: 52 U/L (ref 39–117)
BUN: 19 mg/dL (ref 6–23)
CO2: 26 mEq/L (ref 19–32)
Calcium: 9.5 mg/dL (ref 8.4–10.5)
Chloride: 105 mEq/L (ref 96–112)
Creatinine, Ser: 0.83 mg/dL (ref 0.40–1.20)
GFR: 91.01 mL/min (ref >60.00)
Glucose, Bld: 94 mg/dL (ref 70–99)
Potassium: 4.2 mEq/L (ref 3.5–5.1)
Sodium: 140 mEq/L (ref 135–145)
Total Bilirubin: 0.4 mg/dL (ref 0.2–1.2)
Total Protein: 7.7 g/dL (ref 6.0–8.3)

## 2014-04-28 LAB — LIPID PANEL
Cholesterol: 176 mg/dL (ref 0–200)
HDL: 62.2 mg/dL (ref >39.00)
LDL Cholesterol: 100 mg/dL — ABNORMAL HIGH (ref 0–99)
NonHDL: 113.8
Total CHOL/HDL Ratio: 3
Triglycerides: 68 mg/dL (ref 0.0–149.0)
VLDL: 13.6 mg/dL (ref 0.0–40.0)

## 2014-04-28 LAB — POCT URINALYSIS DIPSTICK
Bilirubin, UA: NEGATIVE
Blood, UA: NEGATIVE
Glucose, UA: NEGATIVE
Ketones, UA: NEGATIVE
Leukocytes, UA: NEGATIVE
Nitrite, UA: NEGATIVE
Spec Grav, UA: 1.025
Urobilinogen, UA: 0.2
pH, UA: 5.5

## 2014-04-28 LAB — TSH: TSH: 4.77 u[IU]/mL — ABNORMAL HIGH (ref 0.35–4.50)

## 2014-05-05 ENCOUNTER — Encounter: Payer: Self-pay | Admitting: Family Medicine

## 2014-05-05 ENCOUNTER — Ambulatory Visit (INDEPENDENT_AMBULATORY_CARE_PROVIDER_SITE_OTHER): Payer: BLUE CROSS/BLUE SHIELD | Admitting: Family Medicine

## 2014-05-05 VITALS — BP 130/80 | Temp 98.0°F | Ht 64.0 in | Wt 208.0 lb

## 2014-05-05 DIAGNOSIS — R1310 Dysphagia, unspecified: Secondary | ICD-10-CM

## 2014-05-05 DIAGNOSIS — I872 Venous insufficiency (chronic) (peripheral): Secondary | ICD-10-CM

## 2014-05-05 DIAGNOSIS — I10 Essential (primary) hypertension: Secondary | ICD-10-CM

## 2014-05-05 DIAGNOSIS — K222 Esophageal obstruction: Secondary | ICD-10-CM

## 2014-05-05 DIAGNOSIS — M79675 Pain in left toe(s): Secondary | ICD-10-CM

## 2014-05-05 DIAGNOSIS — M722 Plantar fascial fibromatosis: Secondary | ICD-10-CM

## 2014-05-05 LAB — URIC ACID: Uric Acid, Serum: 9.3 mg/dL — ABNORMAL HIGH (ref 2.4–7.0)

## 2014-05-05 MED ORDER — ATENOLOL 25 MG PO TABS: 25.0000 mg | ORAL_TABLET | Freq: Every day | ORAL | Status: DC

## 2014-05-05 NOTE — Progress Notes (Signed)
Pre visit review using our clinic review tool, if applicable. No additional management support is needed unless otherwise documented below in the visit note. 

## 2014-05-05 NOTE — Patient Instructions (Signed)
Stop the potassium Celebrex and aspirin  Tenormin 25 mg...Marland KitchenMarland KitchenMarland Kitchen 1 daily in the morning  Lasix 20 mg.......Marland Kitchen 1 Monday Wednesday Friday  Wear those for leg pantyhose  Avoid salt  Elevate your legs in the evening  Soft diet........Marland Kitchen we will get you a GI consult with Dr. Ardis Hughs ASAP  Also get you consult with Noel Gerold to clear up the plantar fasciitis  Blood pressure check daily in the morning  Return in one month for follow-up..........Marland Kitchen bring a record of all your blood pressure readings and the device when you return

## 2014-05-05 NOTE — Progress Notes (Signed)
Subjective:    Patient ID: Katie Richards, female    DOB: 04-Feb-1956, 59 y.o.   MRN: [id number]  HPI Katie Richards is a 59 year old married female nonsmoker who comes in today for general physical examination  She says he feels well but over the past 6-12 months she's had difficulty swallowing. She feels like food getting stuck in her esophagus. On December 15 she was at work she had eaten and 20 minutes later developed severe chest pain. She went the emergency room had a complete evaluation and then for whatever reason was sent to cardiology for a stress test. She had no cardiac symptoms. Stress test the course was normal. She also feels like she has reflux of acid in her esophagus. She has a funny taste in her mouth and again difficulty swallowing solid food.  She gets routine eye care, dental care, BSE monthly, and you mammography, colonoscopy 2009 by Katie Richards normal  She's had plantar fasciitis now for couple years and it won't go away. She's been seeing the podiatrist. We'll get her set up a consult with Katie Richards  She sees GYN every August of for checkup and Paps. She's had a period once a year. She's had a complete GYN workup for uterine cancer which is been negative. I recommend she continue follow with GYN  Vaccinations up-to-date  Another new problem is pain in her left great toe. It occurred most recently when she had a steak. This happened before but never this severe. Question family history of gout   Review of Systems  Constitutional: Negative.   HENT: Negative.   Eyes: Negative.   Respiratory: Negative.   Cardiovascular: Negative.   Gastrointestinal: Negative.   Endocrine: Negative.   Genitourinary: Negative.   Musculoskeletal: Negative.   Skin: Negative.   Allergic/Immunologic: Negative.   Neurological: Negative.   Hematological: Negative.   Psychiatric/Behavioral: Negative.        Objective:   Physical Exam  Constitutional: She appears well-developed and  well-nourished.  HENT:  Head: Normocephalic and atraumatic.  Right Ear: External ear normal.  Left Ear: External ear normal.  Nose: Nose normal.  Mouth/Throat: Oropharynx is clear and moist.  Eyes: EOM are normal. Pupils are equal, round, and reactive to light.  Neck: Normal range of motion. Neck supple. No JVD present. No tracheal deviation present. No thyromegaly present.  Cardiovascular: Normal rate, regular rhythm, normal heart sounds and intact distal pulses.  Exam reveals no gallop and no friction rub.   No murmur heard. Pulmonary/Chest: Effort normal and breath sounds normal. No stridor. No respiratory distress. She has no wheezes. She has no rales. She exhibits no tenderness.  Abdominal: Soft. Bowel sounds are normal. She exhibits no distension and no mass. There is no tenderness. There is no rebound and no guarding.  Genitourinary:  Bilateral breast exam normal  Pelvic and Pap by GYN  Musculoskeletal: Normal range of motion.  Swelling left great toe  Lymphadenopathy:    She has no cervical adenopathy.  Neurological: She is alert. She has normal reflexes. No cranial nerve deficit. She exhibits normal muscle tone. Coordination normal.  Skin: Skin is warm and dry. No rash noted. No erythema. No pallor.  Psychiatric: She has a normal mood and affect. Her behavior is normal. Judgment and thought content normal.          Assessment & Plan:  Healthy female  Hypertension at goal continue current therapy except stop the potassium and Lasix because of the difficulty swallowing until we  get a GI evaluation.  History of osteoarthritis do not take the Celebrex until we get a GI evaluation  Plantar fasciitis refer to Katie Richards  Difficulty swallowing probably has esophageal stricture refer to Katie Richards or GI ASAP  Overweight.......... down to 208 encourage daily exercise and continued weight loss

## 2014-05-12 ENCOUNTER — Other Ambulatory Visit: Payer: Self-pay | Admitting: *Deleted

## 2014-05-12 MED ORDER — ALLOPURINOL 100 MG PO TABS: 100.0000 mg | ORAL_TABLET | Freq: Every day | ORAL | Status: DC

## 2014-06-07 ENCOUNTER — Ambulatory Visit: Payer: BLUE CROSS/BLUE SHIELD | Admitting: Family Medicine

## 2014-06-09 ENCOUNTER — Ambulatory Visit (INDEPENDENT_AMBULATORY_CARE_PROVIDER_SITE_OTHER): Payer: BLUE CROSS/BLUE SHIELD | Admitting: Family Medicine

## 2014-06-09 ENCOUNTER — Encounter: Payer: Self-pay | Admitting: Family Medicine

## 2014-06-09 VITALS — BP 120/84 | Temp 98.5°F | Wt 205.0 lb

## 2014-06-09 DIAGNOSIS — I10 Essential (primary) hypertension: Secondary | ICD-10-CM

## 2014-06-09 LAB — BASIC METABOLIC PANEL
BUN: 15 mg/dL (ref 6–23)
CO2: 28 mEq/L (ref 19–32)
Calcium: 9.9 mg/dL (ref 8.4–10.5)
Chloride: 107 mEq/L (ref 96–112)
Creatinine, Ser: 0.86 mg/dL (ref 0.40–1.20)
GFR: 87.32 mL/min (ref >60.00)
Glucose, Bld: 91 mg/dL (ref 70–99)
Potassium: 4.2 mEq/L (ref 3.5–5.1)
Sodium: 141 mEq/L (ref 135–145)

## 2014-06-09 NOTE — Progress Notes (Signed)
   Subjective:    Patient ID: GWYNETH FERNANDEZ, female    DOB: 13-Feb-1956, 59 y.o.   MRN: [id number]  HPI   Earlene is a 59 year old female nonsmoker who comes in today for follow-up of hypertension. We put her on Tenormin 25 mg daily along with Lasix 20 mg Monday Wednesday Friday. She couldn't tolerate the Lasix daily at gave her low potassium. We therefore decrease the Lasix to Monday Wednesday Friday and held the potassium. She says she feels well has an occasional leg cramp.  BP today 120/84  Her blood pressures she brings in are all elevated. She checks her blood pressure in the morning and then takes her medication. Advised to check her blood pressure after she takes her medication   Review of Systems  review of systems otherwise negative     Objective:   Physical Exam  well-developed well-nourished female no acute distress vital signs stable she's afebrile BP 120/84        Assessment & Plan:   hypertension at goal.......... continue current therapy follow-up CPX in January.

## 2014-06-09 NOTE — Patient Instructions (Signed)
Continue current medications  Walk 30 minutes daily  Avoid salt  Follow-up next January for your annual  Labs one week prior  [facility] check your potassium level today

## 2014-06-09 NOTE — Progress Notes (Signed)
Pre visit review using our clinic review tool, if applicable. No additional management support is needed unless otherwise documented below in the visit note. 

## 2014-06-18 ENCOUNTER — Other Ambulatory Visit: Payer: Self-pay | Admitting: Family Medicine

## 2014-06-24 ENCOUNTER — Encounter: Payer: Self-pay | Admitting: Gastroenterology

## 2014-06-24 ENCOUNTER — Ambulatory Visit (INDEPENDENT_AMBULATORY_CARE_PROVIDER_SITE_OTHER): Payer: BLUE CROSS/BLUE SHIELD | Admitting: Gastroenterology

## 2014-06-24 VITALS — BP 124/68 | HR 68 | Ht 63.0 in | Wt 204.1 lb

## 2014-06-24 DIAGNOSIS — R1310 Dysphagia, unspecified: Secondary | ICD-10-CM

## 2014-06-24 NOTE — Patient Instructions (Signed)
You will be set up for an upper endoscopy for dysphagia, early satiety. Please start once daily OTC omeprazole (20mg  pill, one pill 20-30 min prior to BF meal).

## 2014-06-24 NOTE — Progress Notes (Signed)
HPI: This is a very pleasant 59 yo woman   who was referred to me by Dorena Cookey, MD  to evaluate chest pain.    Colonoscopy 03/2008 Ardis Hughs for [place] colon polyps; external hemorrhoids, no polyps; recommended colonoscopy in 10 years  She was having chest pains in December, went to ER.  Eventually has cardiac stress test and it was fine.  Tells me her heart is fine.  Has had an issue with swallowing for many years.  Feels food is goes up into nasal passage.  She has had dysphagia to solid food and pills.  Also regurge at times.  Overall her weight has gone down 10 pounds unintentionally.  Lower appetite, actually supplementing with ensure at times.  Mild early satiety.  Takes motrin (4-6 per day) for gout.  Previously taking celebrex.  Was taking baby ASA TIW until this past January.  Does only very rarely get pyrosis.  Not on chronic meds for it.   Review of systems: Pertinent positive and negative review of systems were noted in the above HPI section. Complete review of systems was performed and was otherwise normal.    Past Medical History  Diagnosis Date  . Hypertension   . DUB (dysfunctional uterine bleeding)   . Uterine fibroid   . History of migraine headaches   . Allergic rhinitis     Past Surgical History  Procedure Laterality Date  . Childbirth      x2  . Reverese tubal ligation    . Abdominal hysterectomy      Current Outpatient Prescriptions  Medication Sig Dispense Refill  . allopurinol (ZYLOPRIM) 100 MG tablet Take 1 tablet (100 mg total) by mouth daily. 90 tablet 3  . atenolol (TENORMIN) 25 MG tablet Take 1 tablet (25 mg total) by mouth daily. 100 tablet 3  . CALCIUM-VITAMIN D PO Take 1 tablet by mouth daily.     . cyanocobalamin 100 MCG tablet Take 100 mcg by mouth daily.    . furosemide (LASIX) 20 MG tablet TAKE ONE TABLET BY MOUTH EVERY MORNING 100 tablet 0   No current facility-administered medications for this visit.    Allergies as of  06/24/2014 - Review Complete 06/24/2014  Allergen Reaction Noted  . Sulfonamide derivatives Hives     Family History  Problem Relation Age of Onset  . Alcohol abuse Brother   . Hypertension Mother   . Lung cancer Neg Hx   . Stroke Brother   . Prostate cancer Father   . Pancreatic cancer Brother   . Hypertension Father   . Stroke Mother   . Colon cancer Neg Hx   . Colon polyps Brother   . Esophageal cancer Neg Hx   . Diabetes Neg Hx     History   Social History  . Marital Status: Married    Spouse Name: N/A  . Number of Children: 2  . Years of Education: N/A   Occupational History  . [employer]    Social History Main Topics  . Smoking status: Never Smoker   . Smokeless tobacco: Never Used  . Alcohol Use: 0.0 oz/week    0 Standard drinks or equivalent per week     Comment: Rarely  . Drug Use: No  . Sexual Activity: Not on file   Other Topics Concern  . Not on file   Social History Narrative       Physical Exam: Ht 5\' 3"  (1.6 m)  Wt 204 lb 2 oz (92.59  kg)  BMI 36.17 kg/m2 Constitutional: generally well-appearing Psychiatric: alert and oriented x3 Eyes: extraocular movements intact Mouth: oral pharynx moist, no lesions Neck: supple no lymphadenopathy Cardiovascular: heart regular rate and rhythm Lungs: clear to auscultation bilaterally Abdomen: soft, nontender, nondistended, no obvious ascites, no peritoneal signs, normal bowel sounds Extremities: no lower extremity edema bilaterally Skin: no lesions on visible extremities    Assessment and plan: 59 y.o. female with  intermittent dysphasia, early satiety  She has also lost about 10 pounds in the past year rather unintentionally. She takes several ibuprofen daily and this can be upsetting her stomach, could contribute to regurgitation at night, dyspepsia symptoms. I recommended that she start once daily proton pump inhibitor she try to refrain from too many NSAIDs on a chronic basis. She does have  mild intermittent dysphagia and early satiety now would like to  proceed with EGD at her soonest convenience. I see no reason for any further blood tests or imaging studies.    Cc : Dorena Cookey, MD

## 2014-06-30 ENCOUNTER — Encounter: Payer: Self-pay | Admitting: Gastroenterology

## 2014-07-22 ENCOUNTER — Encounter: Payer: BLUE CROSS/BLUE SHIELD | Admitting: Gastroenterology

## 2014-08-08 ENCOUNTER — Ambulatory Visit (INDEPENDENT_AMBULATORY_CARE_PROVIDER_SITE_OTHER): Payer: BLUE CROSS/BLUE SHIELD | Admitting: Family Medicine

## 2014-08-08 ENCOUNTER — Encounter: Payer: Self-pay | Admitting: Family Medicine

## 2014-08-08 VITALS — BP 144/80 | Temp 98.3°F | Ht 64.0 in | Wt 202.3 lb

## 2014-08-08 DIAGNOSIS — J0100 Acute maxillary sinusitis, unspecified: Secondary | ICD-10-CM

## 2014-08-08 MED ORDER — AZITHROMYCIN 250 MG PO TABS: ORAL_TABLET | ORAL | Status: DC

## 2014-08-08 NOTE — Progress Notes (Signed)
   Subjective:    Patient ID: Katie Richards, female    DOB: 1955/08/10, 59 y.o.   MRN: [id number]  HPI Here for one week of sinus pressure, PND, blowing bloody mucus from the nose. She has a dry cough as well.    Review of Systems  Constitutional: Negative.   HENT: Positive for congestion, postnasal drip and sinus pressure.   Eyes: Negative.   Respiratory: Positive for cough.        Objective:   Physical Exam  Constitutional: She appears well-developed and well-nourished.  HENT:  Right Ear: External ear normal.  Left Ear: External ear normal.  Nose: Nose normal.  Mouth/Throat: Oropharynx is clear and moist.  Eyes: Conjunctivae are normal.  Pulmonary/Chest: Effort normal and breath sounds normal. No respiratory distress. She has no wheezes. She has no rales.  Lymphadenopathy:    She has no cervical adenopathy.          Assessment & Plan:  Early sinusitis. Treat with Mucinex and a Zpack.

## 2014-08-08 NOTE — Progress Notes (Signed)
Pre visit review using our clinic review tool, if applicable. No additional management support is needed unless otherwise documented below in the visit note. 

## 2014-09-13 ENCOUNTER — Other Ambulatory Visit: Payer: Self-pay

## 2014-09-13 MED ORDER — FUROSEMIDE 20 MG PO TABS: 20.0000 mg | ORAL_TABLET | Freq: Every morning | ORAL | Status: DC

## 2014-12-16 ENCOUNTER — Ambulatory Visit (INDEPENDENT_AMBULATORY_CARE_PROVIDER_SITE_OTHER): Payer: BLUE CROSS/BLUE SHIELD | Admitting: *Deleted

## 2014-12-16 DIAGNOSIS — Z23 Encounter for immunization: Secondary | ICD-10-CM

## 2015-01-06 ENCOUNTER — Other Ambulatory Visit: Payer: Self-pay

## 2015-01-06 LAB — HM PAP SMEAR

## 2015-01-09 LAB — CYTOLOGY - PAP

## 2015-01-26 ENCOUNTER — Other Ambulatory Visit: Payer: Self-pay | Admitting: Obstetrics

## 2015-02-20 ENCOUNTER — Other Ambulatory Visit: Payer: Self-pay

## 2015-02-20 DIAGNOSIS — Z1231 Encounter for screening mammogram for malignant neoplasm of breast: Secondary | ICD-10-CM

## 2015-03-28 ENCOUNTER — Ambulatory Visit
Admission: RE | Admit: 2015-03-28 | Discharge: 2015-03-28 | Disposition: A | Discharge status: Home / Self Care | Payer: BLUE CROSS/BLUE SHIELD | Source: Ambulatory Visit

## 2015-03-28 DIAGNOSIS — Z1231 Encounter for screening mammogram for malignant neoplasm of breast: Secondary | ICD-10-CM

## 2015-04-12 NOTE — Patient Instructions (Addendum)
Your procedure is scheduled on:  Friday, Jan. 6, 2016  Enter through the [employer] [facility] at:  10:30 A.M.  Pick up the phone at the desk and dial [phone].  Call this number if you have problems the morning of surgery: [phone].  Remember: Do NOT eat food:  After Midnight Thursday, Jan. 5, 2016 Do NOT drink clear liquids after:  8:00 A.M. Day of surgery Take these medicines the morning of surgery with a SIP OF WATER:  Atenolol, Potassium  Do NOT wear jewelry (body piercing), metal hair clips/bobby pins, make-up, or nail polish. Do NOT wear lotions, powders, or perfumes.  You may wear deoderant. Do NOT shave for 48 hours prior to surgery. Do NOT bring valuables to the hospital. Contacts, dentures, or bridgework may not be worn into surgery.  Have a responsible adult drive you home and stay with you for 24 hours after your procedure

## 2015-04-13 ENCOUNTER — Encounter (HOSPITAL_COMMUNITY): Payer: Self-pay

## 2015-04-13 ENCOUNTER — Encounter (HOSPITAL_COMMUNITY)
Admission: RE | Admit: 2015-04-13 | Discharge: 2015-04-13 | Disposition: A | Discharge status: Home / Self Care | Payer: BLUE CROSS/BLUE SHIELD | Source: Ambulatory Visit | Attending: Obstetrics | Admitting: Obstetrics

## 2015-04-13 DIAGNOSIS — N950 Postmenopausal bleeding: Secondary | ICD-10-CM | POA: Diagnosis not present

## 2015-04-13 DIAGNOSIS — Z01818 Encounter for other preprocedural examination: Secondary | ICD-10-CM | POA: Diagnosis present

## 2015-04-13 HISTORY — DX: Unspecified osteoarthritis, unspecified site: M19.90

## 2015-04-13 HISTORY — DX: Anemia, unspecified: D64.9

## 2015-04-13 HISTORY — DX: Unspecified asthma, uncomplicated: J45.909

## 2015-04-13 LAB — CBC
HCT: 40.8 % (ref 36.0–46.0)
Hemoglobin: 13.1 g/dL (ref 12.0–15.0)
MCH: 27.6 pg (ref 26.0–34.0)
MCHC: 32.1 g/dL (ref 30.0–36.0)
MCV: 86.1 fL (ref 78.0–100.0)
Platelets: 190 10*3/uL (ref 150–400)
RBC: 4.74 MIL/uL (ref 3.87–5.11)
RDW: 14.5 % (ref 11.5–15.5)
WBC: 4 10*3/uL (ref 4.0–10.5)

## 2015-04-13 LAB — BASIC METABOLIC PANEL
Anion gap: 10 (ref 5–15)
BUN: 14 mg/dL (ref 6–20)
CO2: 25 mmol/L (ref 22–32)
Calcium: 9.3 mg/dL (ref 8.9–10.3)
Chloride: 106 mmol/L (ref 101–111)
Creatinine, Ser: 0.83 mg/dL (ref 0.44–1.00)
GFR calc Af Amer: >60 mL/min (ref >60)
GFR calc non Af Amer: >60 mL/min (ref >60)
Glucose, Bld: 96 mg/dL (ref 65–99)
Potassium: 3.9 mmol/L (ref 3.5–5.1)
Sodium: 141 mmol/L (ref 135–145)

## 2015-04-20 ENCOUNTER — Encounter (HOSPITAL_COMMUNITY): Payer: Self-pay | Admitting: Obstetrics

## 2015-04-20 NOTE — H&P (Signed)
60 y.o. presents with postmenopausal bleeding.  Reports yearly PMB since 2013. US showed thickened endometrial stripe at 0.75cm.  EMB returned with evidence of endometrial polyp, no hyperplasia or malignancy  Past Medical History  Diagnosis Date  . Hypertension   . History of migraine headaches   . Allergic rhinitis   . Asthma     Childhood,   . Arthritis   . Anemia     History of    Past Surgical History  Procedure Laterality Date  . Reverese tubal ligation    . Bilateral carpal tunnel release       Social History   Social History  . Marital Status: Married    Spouse Name: N/A  . Number of Children: 2  . Years of Education: N/A   Occupational History  . [employer]    Social History Main Topics  . Smoking status: Never Smoker   . Smokeless tobacco: Never Used  . Alcohol Use: 0.0 oz/week    0 Standard drinks or equivalent per week     Comment: Rarely  . Drug Use: No  . Sexual Activity: Not on file   Other Topics Concern  . Not on file   Social History Narrative   Sulfonamide derivatives    Filed Vitals:   04/21/15 1035  BP: 147/87  Pulse: 58  Temp: 98.2 F (36.8 C)  Resp: 20     General:  NAD     A/P   60 y.o.  Presents with recurrent postmenopausal bleeding and suspected endometrial polyp.  Endometrial biopsy with suggestion of polyp.  Discussed options for management with patient--given recurrent PMB x 3-4 years, she elects for hysteroscopy, polypectomy, and dilation and curettage.  Discussed risks to include infection, bleeding, damage to surrounding structures, risk of uterine perforation, recurrent PMB, need for additional procedures.  Consent signed.  All questions answered  [place]

## 2015-04-21 ENCOUNTER — Ambulatory Visit (HOSPITAL_COMMUNITY): Payer: BLUE CROSS/BLUE SHIELD | Admitting: Anesthesiology

## 2015-04-21 ENCOUNTER — Ambulatory Visit (HOSPITAL_COMMUNITY)
Admission: RE | Admit: 2015-04-21 | Discharge: 2015-04-21 | Disposition: A | Discharge status: Home / Self Care | Payer: BLUE CROSS/BLUE SHIELD | Source: Ambulatory Visit | Attending: Obstetrics | Admitting: Obstetrics

## 2015-04-21 ENCOUNTER — Encounter (HOSPITAL_COMMUNITY)
Admission: RE | Disposition: A | Discharge status: Home / Self Care | Payer: Self-pay | Source: Ambulatory Visit | Attending: Obstetrics

## 2015-04-21 ENCOUNTER — Encounter (HOSPITAL_COMMUNITY): Payer: Self-pay | Admitting: Anesthesiology

## 2015-04-21 DIAGNOSIS — I10 Essential (primary) hypertension: Secondary | ICD-10-CM | POA: Insufficient documentation

## 2015-04-21 DIAGNOSIS — Z7982 Long term (current) use of aspirin: Secondary | ICD-10-CM | POA: Insufficient documentation

## 2015-04-21 DIAGNOSIS — Z8249 Family history of ischemic heart disease and other diseases of the circulatory system: Secondary | ICD-10-CM | POA: Diagnosis not present

## 2015-04-21 DIAGNOSIS — I872 Venous insufficiency (chronic) (peripheral): Secondary | ICD-10-CM | POA: Diagnosis not present

## 2015-04-21 DIAGNOSIS — Z882 Allergy status to sulfonamides status: Secondary | ICD-10-CM | POA: Diagnosis not present

## 2015-04-21 DIAGNOSIS — N8501 Benign endometrial hyperplasia: Secondary | ICD-10-CM | POA: Diagnosis not present

## 2015-04-21 DIAGNOSIS — Z79899 Other long term (current) drug therapy: Secondary | ICD-10-CM | POA: Diagnosis not present

## 2015-04-21 DIAGNOSIS — N950 Postmenopausal bleeding: Secondary | ICD-10-CM | POA: Diagnosis present

## 2015-04-21 DIAGNOSIS — Z823 Family history of stroke: Secondary | ICD-10-CM | POA: Diagnosis not present

## 2015-04-21 HISTORY — PX: HYSTEROSCOPY: SHX211

## 2015-04-21 SURGERY — HYSTEROSCOPY
Anesthesia: General | Site: Vagina

## 2015-04-21 MED ORDER — LIDOCAINE HCL 1 % IJ SOLN
INTRAMUSCULAR | Status: AC
  Filled 2015-04-21: qty 20

## 2015-04-21 MED ORDER — LACTATED RINGERS IV SOLN
INTRAVENOUS | Status: DC
  Administered 2015-04-21: 11:00:00 via INTRAVENOUS

## 2015-04-21 MED ORDER — PROPOFOL 10 MG/ML IV BOLUS
INTRAVENOUS | Status: DC | PRN
  Administered 2015-04-21: 180 mg via INTRAVENOUS

## 2015-04-21 MED ORDER — MIDAZOLAM HCL 2 MG/2ML IJ SOLN
INTRAMUSCULAR | Status: AC
  Filled 2015-04-21: qty 2

## 2015-04-21 MED ORDER — LIDOCAINE HCL 1 % IJ SOLN
INTRAMUSCULAR | Status: DC | PRN
  Administered 2015-04-21: 10 mL

## 2015-04-21 MED ORDER — OXYCODONE HCL 5 MG PO TABS: 5.0000 mg | ORAL_TABLET | Freq: Once | ORAL | Status: DC | PRN

## 2015-04-21 MED ORDER — PROPOFOL 10 MG/ML IV BOLUS
INTRAVENOUS | Status: AC
  Filled 2015-04-21: qty 20

## 2015-04-21 MED ORDER — SCOPOLAMINE 1 MG/3DAYS TD PT72
1.0000 | MEDICATED_PATCH | Freq: Once | TRANSDERMAL | Status: DC
  Administered 2015-04-21: 1.5 mg via TRANSDERMAL

## 2015-04-21 MED ORDER — IBUPROFEN 600 MG PO TABS: 600.0000 mg | ORAL_TABLET | Freq: Four times a day (QID) | ORAL | Status: DC | PRN

## 2015-04-21 MED ORDER — LIDOCAINE HCL (CARDIAC) 20 MG/ML IV SOLN
INTRAVENOUS | Status: DC | PRN
  Administered 2015-04-21: 70 mg via INTRAVENOUS
  Administered 2015-04-21: 30 mg via INTRAVENOUS

## 2015-04-21 MED ORDER — MEPERIDINE HCL 25 MG/ML IJ SOLN: 6.2500 mg | INTRAMUSCULAR | Status: DC | PRN

## 2015-04-21 MED ORDER — ONDANSETRON HCL 4 MG/2ML IJ SOLN
INTRAMUSCULAR | Status: AC
  Filled 2015-04-21: qty 2

## 2015-04-21 MED ORDER — SCOPOLAMINE 1 MG/3DAYS TD PT72: 1.0000 | MEDICATED_PATCH | Freq: Once | TRANSDERMAL | Status: DC

## 2015-04-21 MED ORDER — HYDROMORPHONE HCL 1 MG/ML IJ SOLN
INTRAMUSCULAR | Status: AC
  Filled 2015-04-21: qty 1

## 2015-04-21 MED ORDER — SODIUM CHLORIDE 0.9 % IR SOLN
Status: DC | PRN
  Administered 2015-04-21: 3000 mL
  Administered 2015-04-21 (×2): 1000 mL
  Administered 2015-04-21 (×2): 3000 mL

## 2015-04-21 MED ORDER — HYDROMORPHONE HCL 1 MG/ML IJ SOLN
0.2500 mg | INTRAMUSCULAR | Status: DC | PRN
  Administered 2015-04-21 (×2): 0.5 mg via INTRAVENOUS

## 2015-04-21 MED ORDER — OXYCODONE HCL 5 MG/5ML PO SOLN
5.0000 mg | Freq: Once | ORAL | Status: DC | PRN
Start: 2015-04-21 — End: 2015-04-21

## 2015-04-21 MED ORDER — FENTANYL CITRATE (PF) 100 MCG/2ML IJ SOLN
INTRAMUSCULAR | Status: DC | PRN
  Administered 2015-04-21 (×2): 50 ug via INTRAVENOUS

## 2015-04-21 MED ORDER — EPHEDRINE SULFATE 50 MG/ML IJ SOLN
INTRAMUSCULAR | Status: DC | PRN
  Administered 2015-04-21 (×2): 10 mg via INTRAVENOUS

## 2015-04-21 MED ORDER — FENTANYL CITRATE (PF) 100 MCG/2ML IJ SOLN
INTRAMUSCULAR | Status: AC
Start: 2015-04-21 — End: 2015-04-21
  Filled 2015-04-21: qty 2

## 2015-04-21 MED ORDER — DEXAMETHASONE SODIUM PHOSPHATE 4 MG/ML IJ SOLN
INTRAMUSCULAR | Status: AC
  Filled 2015-04-21: qty 1

## 2015-04-21 MED ORDER — ONDANSETRON HCL 4 MG/2ML IJ SOLN
INTRAMUSCULAR | Status: DC | PRN
  Administered 2015-04-21: 4 mg via INTRAVENOUS

## 2015-04-21 MED ORDER — KETOROLAC TROMETHAMINE 30 MG/ML IJ SOLN
INTRAMUSCULAR | Status: DC | PRN
  Administered 2015-04-21: 30 mg via INTRAVENOUS

## 2015-04-21 MED ORDER — EPHEDRINE 5 MG/ML INJ
INTRAVENOUS | Status: AC
  Filled 2015-04-21: qty 10

## 2015-04-21 MED ORDER — LACTATED RINGERS IV SOLN
INTRAVENOUS | Status: DC
  Administered 2015-04-21 (×2): via INTRAVENOUS

## 2015-04-21 MED ORDER — SCOPOLAMINE 1 MG/3DAYS TD PT72
MEDICATED_PATCH | TRANSDERMAL | Status: AC
  Administered 2015-04-21: 1.5 mg via TRANSDERMAL
  Filled 2015-04-21: qty 1

## 2015-04-21 MED ORDER — MIDAZOLAM HCL 2 MG/2ML IJ SOLN
INTRAMUSCULAR | Status: DC | PRN
  Administered 2015-04-21: 1 mg via INTRAVENOUS

## 2015-04-21 MED ORDER — LIDOCAINE HCL (CARDIAC) 20 MG/ML IV SOLN
INTRAVENOUS | Status: AC
  Filled 2015-04-21: qty 5

## 2015-04-21 MED ORDER — DEXAMETHASONE SODIUM PHOSPHATE 10 MG/ML IJ SOLN
INTRAMUSCULAR | Status: DC | PRN
  Administered 2015-04-21: 4 mg via INTRAVENOUS

## 2015-04-21 MED ORDER — KETOROLAC TROMETHAMINE 30 MG/ML IJ SOLN
INTRAMUSCULAR | Status: AC
  Filled 2015-04-21: qty 1

## 2015-04-21 SURGICAL SUPPLY — 18 items
CANISTER SUCT 3000ML PPV (MISCELLANEOUS) ×8 IMPLANT
CATH ROBINSON RED A/P 16FR (CATHETERS) ×3 IMPLANT
CLOTH BEACON ORANGE TIMEOUT ST (SAFETY) ×3 IMPLANT
CONTAINER PREFILL 10% NBF 60ML (FORM) ×4 IMPLANT
DEVICE MYOSURE REACH (MISCELLANEOUS) ×2 IMPLANT
GLOVE BIO SURGEON STRL SZ 6 (GLOVE) ×3 IMPLANT
GLOVE BIOGEL PI IND STRL 6.5 (GLOVE) ×2 IMPLANT
GLOVE BIOGEL PI IND STRL 7.0 (GLOVE) ×3 IMPLANT
GLOVE BIOGEL PI INDICATOR 6.5 (GLOVE) ×1
GLOVE BIOGEL PI INDICATOR 7.0 (GLOVE) ×2
GOWN STRL REUS W/TWL LRG LVL3 (GOWN DISPOSABLE) ×6 IMPLANT
LOOP ANGLED CUTTING 22FR (CUTTING LOOP) IMPLANT
PACK VAGINAL MINOR WOMEN LF (CUSTOM PROCEDURE TRAY) ×3 IMPLANT
PAD OB MATERNITY 4.3X12.25 (PERSONAL CARE ITEMS) ×3 IMPLANT
TOWEL OR 17X24 6PK STRL BLUE (TOWEL DISPOSABLE) ×6 IMPLANT
TUBING AQUILEX INFLOW (TUBING) ×3 IMPLANT
TUBING AQUILEX OUTFLOW (TUBING) ×3 IMPLANT
WATER STERILE IRR 1000ML POUR (IV SOLUTION) ×3 IMPLANT

## 2015-04-21 NOTE — Anesthesia Preprocedure Evaluation (Signed)
Anesthesia Evaluation  Patient identified by MRN, date of birth, ID band Patient awake    Reviewed: Allergy & Precautions, NPO status , Patient's Chart, lab work & pertinent test results  Airway Mallampati: I  TM Distance: >3 FB Neck ROM: Full    Dental  (+) Teeth Intact, Dental Advisory Given   Pulmonary    breath sounds clear to auscultation       Cardiovascular hypertension, Pt. on medications and Pt. on home beta blockers + Peripheral Vascular Disease   Rhythm:Regular Rate:Normal     Neuro/Psych    GI/Hepatic   Endo/Other  Morbid obesity  Renal/GU      Musculoskeletal   Abdominal   Peds  Hematology   Anesthesia Other Findings   Reproductive/Obstetrics                             Anesthesia Physical Anesthesia Plan  ASA: III  Anesthesia Plan: General   Post-op Pain Management:    Induction: Intravenous  Airway Management Planned: LMA  Additional Equipment:   Intra-op Plan:   Post-operative Plan: Extubation in OR  Informed Consent: I have reviewed the patients History and Physical, chart, labs and discussed the procedure including the risks, benefits and alternatives for the proposed anesthesia with the patient or authorized representative who has indicated his/her understanding and acceptance.   Dental advisory given  Plan Discussed with: CRNA, Anesthesiologist and Surgeon  Anesthesia Plan Comments:         Anesthesia Quick Evaluation

## 2015-04-21 NOTE — Anesthesia Procedure Notes (Signed)
Procedure Name: LMA Insertion Date/Time: 04/21/2015 11:56 AM Performed by: Tobin Chad Pre-anesthesia Checklist: Patient identified, Timeout performed, Emergency Drugs available, Suction available and Patient being monitored Patient Re-evaluated:Patient Re-evaluated prior to inductionOxygen Delivery Method: Circle system utilized and Simple face mask Preoxygenation: Pre-oxygenation with 100% oxygen Intubation Type: IV induction Ventilation: Mask ventilation without difficulty LMA: LMA inserted LMA Size: 4.0 Grade View: Grade II Number of attempts: 1 Placement Confirmation: breath sounds checked- equal and bilateral and positive ETCO2

## 2015-04-21 NOTE — Discharge Instructions (Signed)
Pelvic rest x 2 weeks (no intercourse or tampons).  No tub baths or swimming for two weeks.    Call your doctor if you have heavy vaginal bleeding (soaking through a pad an hour or more for >2 hours in a row), temperature >101F, severe nausea, vomiting, severe or worsening abdominal pain, dizziness, shortness of breath, chest pain or any other concerns.  Please take motrin every 6 hours as needed   DISCHARGE INSTRUCTIONS: HYSTEROSCOPY  The following instructions have been prepared to help you care for yourself upon your return home.  May Remove Scop patch on or before 04/24/15.  May take Ibuprofen after 6:15pm today.    Personal hygiene:  Use sanitary pads for vaginal drainage, not tampons.  Shower the day after your procedure.  NO tub baths, pools or Jacuzzis for 2-3 weeks.  Wipe front to back after using the bathroom.  Activity and limitations:  Do NOT drive or operate any equipment for 24 hours. The effects of anesthesia are still present and drowsiness may result.  Do NOT rest in bed all day.  Walking is encouraged.  Walk up and down stairs slowly.  You may resume your normal activity in one to two days or as indicated by your physician. Sexual activity: NO intercourse for at least 2 weeks after the procedure, or as indicated by your Doctor.  Diet: Eat a light meal as desired this evening. You may resume your usual diet tomorrow.  Return to Work: You may resume your work activities in one to two days or as indicated by Marine scientist.  What to expect after your surgery: Expect to have vaginal bleeding/discharge for 2-3 days and spotting for up to 10 days. It is not unusual to have soreness for up to 1-2 weeks. You may have a slight burning sensation when you urinate for the first day. Mild cramps may continue for a couple of days. You may have a regular period in 2-6 weeks.  Call your doctor for any of the following:  Excessive vaginal bleeding or clotting,  saturating and changing one pad every hour.  Inability to urinate 6 hours after discharge from hospital.  Pain not relieved by pain medication.  Fever of 100.4 F or greater.  Unusual vaginal discharge or odor.  Return to office _________________Call for an appointment ___________________ Patients signature: ______________________ Nurses signature ________________________  [facility] [phone]

## 2015-04-21 NOTE — Brief Op Note (Signed)
04/21/2015  12:49 PM  PATIENT:  Katie Richards  60 y.o. female  PRE-OPERATIVE DIAGNOSIS:  PMB  POST-OPERATIVE DIAGNOSIS:  PMB, endometrial polyp  PROCEDURE:  Procedure(s) with comments: HYSTEROSCOPY WITH POLYPECTOMY (N/A) - WITH MYOSURE  SURGEON:  Surgeon(s) and Role:    * Jerelyn Charles, MD - Primary  ANESTHESIA:   general  EBL:  Total I/O In: 1000 [I.V.:1000] Out: 30 [Urine:20; Blood:10]  BLOOD ADMINISTERED:none  DRAINS: none   LOCAL MEDICATIONS USED:  XYLOCAINE   SPECIMEN:  Source of Specimen:  endometrial polyps and curettings  DISPOSITION OF SPECIMEN:  PATHOLOGY  COUNTS:  YES  TOURNIQUET:  * No tourniquets in log *  DICTATION: .Note written in EPIC  PLAN OF CARE: Discharge to home after PACU  PATIENT DISPOSITION:  PACU - hemodynamically stable.   Delay start of Pharmacological VTE agent (>24hrs) due to surgical blood loss or risk of bleeding: yes

## 2015-04-21 NOTE — Transfer of Care (Signed)
Immediate Anesthesia Transfer of Care Note  Patient: Katie Richards  Procedure(s) Performed: Procedure(s) with comments: HYSTEROSCOPY WITH POLYPECTOMY (N/A) - WITH MYOSURE  Patient Location: PACU  Anesthesia Type:General  Level of Consciousness: awake, alert , oriented and patient cooperative  Airway & Oxygen Therapy: Patient Spontanous Breathing and Patient connected to nasal cannula oxygen  Post-op Assessment: Report given to RN and Post -op Vital signs reviewed and stable  Post vital signs: Reviewed and stable  Last Vitals:  Filed Vitals:   04/21/15 1035  BP: 147/87  Pulse: 58  Temp: 36.8 C  Resp: 20    Complications: No apparent anesthesia complications

## 2015-04-21 NOTE — Anesthesia Postprocedure Evaluation (Signed)
Anesthesia Post Note  Patient: Katie Richards  Procedure(s) Performed: Procedure(s) (LRB): HYSTEROSCOPY WITH POLYPECTOMY (N/A)  Patient location during evaluation: PACU Anesthesia Type: General Level of consciousness: awake and alert Pain management: pain level controlled Vital Signs Assessment: post-procedure vital signs reviewed and stable Respiratory status: spontaneous breathing, nonlabored ventilation and respiratory function stable Cardiovascular status: blood pressure returned to baseline and stable Postop Assessment: no signs of nausea or vomiting Anesthetic complications: no    Last Vitals:  Filed Vitals:   04/21/15 1330 04/21/15 1345  BP: 125/81   Pulse: 50 51  Temp:  36.6 C  Resp: 16 16    Last Pain:  Filed Vitals:   04/21/15 1350  PainSc: 3                  Rashed Edler A

## 2015-04-24 ENCOUNTER — Encounter (HOSPITAL_COMMUNITY): Payer: Self-pay | Admitting: Obstetrics

## 2015-04-24 NOTE — Op Note (Signed)
Operative Note  Pre-operative Diagnosis: post menopausal bleeding  Post-operative Diagnosis: endometrial polyps  Surgeon: Jerelyn Charles, MD  Assistants: n/a  Procedure: hysteroscopy, polypectomy with MyoSure, dilation and curettage  Anesthesia: general  Estimated Blood Loss: 10 mL         Drains: none         Specimens: endometrial curettings and polyps to pathology         Findings: Uterus sounded to 10 cm.  Thin, atrophic endometrium with 3 intrauterine polyps identified.   Description of Procedure:         After adequate anesthesia was achieved, the patient was placed in the dorsal lithotomy position in [place].  She was prepped and draped in the usual sterile fashion.  The bladder was drained with a catheter.  A bimanual exam revealed an anteverted uterus.  The bivalve speculum was placed in the vagina and the anterior lip of the cervix grasped with a single-tooth tenaculum.  The cervix was serially dilated with Hank dilators to 6 mm.  The patient was noted to have a long cervical length.  Under direct visualization, the hysteroscope was advanced.  The uterine cavity was surveyed.  Both tubal ostia were identified. The endometrium was noted to be thin and atrophic.  There were multiple endometrial polyps identified.  The MyoSure device was advanced into the uterine cavity.  Under direct visualization, the polyps were resected with the MyoSure.  The patient had a very long cervix, which made manipulation of the MyoSure challenging, but the polyps were resected in their entirety.  The hysteroscope was removed and a sharp curettage was performed.  The hysteroscope was advanced back into the cavity, and a uniform endometrial cavity seen without residual polyps.  The polyps and endometrial curettings were sent to pathology.  The hysteroscope was removed.  All the vaginal instruments were removed.  The cervix was hemostatic.  Counts were correct.  The patient was awakened from anesthesia  and transferred to PACU in stable condition.    [place], [facility]

## 2015-05-12 DIAGNOSIS — N8501 Benign endometrial hyperplasia: Secondary | ICD-10-CM | POA: Insufficient documentation

## 2015-05-26 ENCOUNTER — Other Ambulatory Visit: Payer: Self-pay | Admitting: Family Medicine

## 2015-05-30 ENCOUNTER — Telehealth: Payer: Self-pay | Admitting: Family Medicine

## 2015-05-30 MED ORDER — FUROSEMIDE 20 MG PO TABS: 20.0000 mg | ORAL_TABLET | Freq: Every morning | ORAL | Status: DC

## 2015-05-30 NOTE — Telephone Encounter (Signed)
Patient would like for her Furosemide medication to be refilled.

## 2015-06-05 ENCOUNTER — Other Ambulatory Visit: Payer: Self-pay | Admitting: Family Medicine

## 2015-06-28 ENCOUNTER — Other Ambulatory Visit: Payer: Self-pay | Admitting: Family Medicine

## 2015-07-12 ENCOUNTER — Other Ambulatory Visit (INDEPENDENT_AMBULATORY_CARE_PROVIDER_SITE_OTHER): Payer: BLUE CROSS/BLUE SHIELD

## 2015-07-12 DIAGNOSIS — Z0000 Encounter for general adult medical examination without abnormal findings: Secondary | ICD-10-CM

## 2015-07-12 LAB — LIPID PANEL
Cholesterol: 180 mg/dL (ref 0–200)
HDL: 67.4 mg/dL (ref >39.00)
LDL Cholesterol: 96 mg/dL (ref 0–99)
NonHDL: 112.85
Total CHOL/HDL Ratio: 3
Triglycerides: 85 mg/dL (ref 0.0–149.0)
VLDL: 17 mg/dL (ref 0.0–40.0)

## 2015-07-12 LAB — HEPATIC FUNCTION PANEL
ALT: 14 U/L (ref 0–35)
AST: 16 U/L (ref 0–37)
Albumin: 4.1 g/dL (ref 3.5–5.2)
Alkaline Phosphatase: 58 U/L (ref 39–117)
Bilirubin, Direct: 0.1 mg/dL (ref 0.0–0.3)
Total Bilirubin: 0.6 mg/dL (ref 0.2–1.2)
Total Protein: 6.9 g/dL (ref 6.0–8.3)

## 2015-07-12 LAB — BASIC METABOLIC PANEL
BUN: 18 mg/dL (ref 6–23)
CO2: 26 mEq/L (ref 19–32)
Calcium: 9.5 mg/dL (ref 8.4–10.5)
Chloride: 107 mEq/L (ref 96–112)
Creatinine, Ser: 0.98 mg/dL (ref 0.40–1.20)
GFR: 74.81 mL/min (ref >60.00)
Glucose, Bld: 85 mg/dL (ref 70–99)
Potassium: 4.1 mEq/L (ref 3.5–5.1)
Sodium: 142 mEq/L (ref 135–145)

## 2015-07-12 LAB — POC URINALSYSI DIPSTICK (AUTOMATED)
Bilirubin, UA: NEGATIVE
Glucose, UA: NEGATIVE
Ketones, UA: NEGATIVE
Leukocytes, UA: NEGATIVE
Nitrite, UA: NEGATIVE
Spec Grav, UA: 1.02
Urobilinogen, UA: 0.2
pH, UA: 6

## 2015-07-12 LAB — CBC WITH DIFFERENTIAL/PLATELET
Basophils Absolute: 0 10*3/uL (ref 0.0–0.1)
Basophils Relative: 0.4 % (ref 0.0–3.0)
Eosinophils Absolute: 0.1 10*3/uL (ref 0.0–0.7)
Eosinophils Relative: 1.3 % (ref 0.0–5.0)
HCT: 38.1 % (ref 36.0–46.0)
Hemoglobin: 12.8 g/dL (ref 12.0–15.0)
Lymphocytes Relative: 26.4 % (ref 12.0–46.0)
Lymphs Abs: 1.2 10*3/uL (ref 0.7–4.0)
MCHC: 33.5 g/dL (ref 30.0–36.0)
MCV: 82.8 fl (ref 78.0–100.0)
Monocytes Absolute: 0.4 10*3/uL (ref 0.1–1.0)
Monocytes Relative: 8.4 % (ref 3.0–12.0)
Neutro Abs: 3 10*3/uL (ref 1.4–7.7)
Neutrophils Relative %: 63.5 % (ref 43.0–77.0)
Platelets: 169 10*3/uL (ref 150.0–400.0)
RBC: 4.6 Mil/uL (ref 3.87–5.11)
RDW: 14.1 % (ref 11.5–15.5)
WBC: 4.7 10*3/uL (ref 4.0–10.5)

## 2015-07-12 LAB — TSH: TSH: 4.36 u[IU]/mL (ref 0.35–4.50)

## 2015-07-19 ENCOUNTER — Ambulatory Visit (INDEPENDENT_AMBULATORY_CARE_PROVIDER_SITE_OTHER): Payer: BLUE CROSS/BLUE SHIELD | Admitting: Family Medicine

## 2015-07-19 ENCOUNTER — Encounter: Payer: Self-pay | Admitting: Family Medicine

## 2015-07-19 VITALS — BP 140/90 | Temp 98.2°F | Ht 64.0 in | Wt 209.0 lb

## 2015-07-19 DIAGNOSIS — I10 Essential (primary) hypertension: Secondary | ICD-10-CM

## 2015-07-19 DIAGNOSIS — M109 Gout, unspecified: Secondary | ICD-10-CM | POA: Insufficient documentation

## 2015-07-19 DIAGNOSIS — Z0000 Encounter for general adult medical examination without abnormal findings: Secondary | ICD-10-CM | POA: Insufficient documentation

## 2015-07-19 DIAGNOSIS — I872 Venous insufficiency (chronic) (peripheral): Secondary | ICD-10-CM

## 2015-07-19 MED ORDER — ALLOPURINOL 100 MG PO TABS: 100.0000 mg | ORAL_TABLET | Freq: Every day | ORAL | Status: DC

## 2015-07-19 MED ORDER — ATENOLOL 25 MG PO TABS: 25.0000 mg | ORAL_TABLET | Freq: Every day | ORAL | Status: DC

## 2015-07-19 MED ORDER — POTASSIUM CHLORIDE CRYS ER 20 MEQ PO TBCR: 40.0000 meq | EXTENDED_RELEASE_TABLET | Freq: Every day | ORAL | Status: DC

## 2015-07-19 MED ORDER — FUROSEMIDE 20 MG PO TABS: 20.0000 mg | ORAL_TABLET | Freq: Every morning | ORAL | Status: DC

## 2015-07-19 NOTE — Progress Notes (Signed)
Pre visit review using our clinic review tool, if applicable. No additional management support is needed unless otherwise documented below in the visit note. 

## 2015-07-19 NOTE — Progress Notes (Signed)
   Subjective:    Patient ID: Katie Richards, female    DOB: 1955-11-11, 60 y.o.   MRN: [id number]  HPI Katie Richards is a 60 year old married female nonsmoker who comes in today for general physical examination because of a history of hypertension, gout, obesity, with a BMI of 36.70  She takes allopurinol 100 mg daily to prevent gout  She takes an aspirin tablet, Tenormin 25 mg, Lasix 20 mg, and a potassium supplement 20 mEq daily for hypertension. BP 140/90 BP at home 130/80  Weight is unchanged since last year. This is been a chronic issue. We have talked in the past about diet exercise and weight loss.  She gets routine eye care, dental care, BSE monthly, annual mammography, colonoscopy 2009 was normal  Vaccinations up-to-date  She had a polypectomy by her GYN 04/13/2015. They've added Provera because she's having persistent menses at age 45 once a year.  Social history.... She is married she lives here in [place] she will work for the [employer] for 30 years ........Marland Kitchen   Review of Systems  Constitutional: Negative.   HENT: Negative.   Eyes: Negative.   Respiratory: Negative.   Cardiovascular: Negative.   Gastrointestinal: Negative.   Endocrine: Negative.   Genitourinary: Negative.   Musculoskeletal: Negative.   Skin: Negative.   Allergic/Immunologic: Negative.   Neurological: Negative.   Hematological: Negative.   Psychiatric/Behavioral: Negative.        Objective:   Physical Exam  Constitutional: She appears well-developed and well-nourished.  HENT:  Head: Normocephalic and atraumatic.  Right Ear: External ear normal.  Left Ear: External ear normal.  Nose: Nose normal.  Mouth/Throat: Oropharynx is clear and moist.  Eyes: EOM are normal. Pupils are equal, round, and reactive to light.  Neck: Normal range of motion. Neck supple. No JVD present. No tracheal deviation present. No thyromegaly present.  Cardiovascular: Normal rate, regular rhythm, normal heart  sounds and intact distal pulses.  Exam reveals no gallop and no friction rub.   No murmur heard. Pulmonary/Chest: Effort normal and breath sounds normal. No stridor. No respiratory distress. She has no wheezes. She has no rales. She exhibits no tenderness.  Abdominal: Soft. Bowel sounds are normal. She exhibits no distension and no mass. There is no tenderness. There is no rebound and no guarding.  Musculoskeletal: Normal range of motion.  Lymphadenopathy:    She has no cervical adenopathy.  Neurological: She is alert. She has normal reflexes. No cranial nerve deficit. She exhibits normal muscle tone. Coordination normal.  Skin: Skin is warm and dry. No rash noted. No erythema. No pallor.  Psychiatric: She has a normal mood and affect. Her behavior is normal. Judgment and thought content normal.  Nursing note and vitals reviewed.         Assessment & Plan:  Hypertension at goal......... continue current therapy  Obesity........... again as we have in the past discussed diet exercise and weight loss  History gout.......... continue allopurinol  Dysfunction uterine bleeding........... followed by GYN

## 2015-07-19 NOTE — Patient Instructions (Signed)
Continue current medications  Start a diet and exercise program as we outlined a  Follow-up in one year sooner if any problem

## 2015-09-13 ENCOUNTER — Encounter: Payer: Self-pay | Admitting: Adult Health

## 2015-09-13 ENCOUNTER — Ambulatory Visit (INDEPENDENT_AMBULATORY_CARE_PROVIDER_SITE_OTHER): Payer: BLUE CROSS/BLUE SHIELD | Admitting: Adult Health

## 2015-09-13 VITALS — BP 142/84 | Temp 98.2°F | Ht 64.0 in | Wt 210.2 lb

## 2015-09-13 DIAGNOSIS — J029 Acute pharyngitis, unspecified: Secondary | ICD-10-CM | POA: Diagnosis not present

## 2015-09-13 DIAGNOSIS — H6593 Unspecified nonsuppurative otitis media, bilateral: Secondary | ICD-10-CM | POA: Diagnosis not present

## 2015-09-13 LAB — POCT RAPID STREP A (OFFICE): Rapid Strep A Screen: NEGATIVE

## 2015-09-13 MED ORDER — MAGIC MOUTHWASH W/LIDOCAINE: 5.0000 mL | Freq: Three times a day (TID) | ORAL | Status: DC | PRN

## 2015-09-13 NOTE — Patient Instructions (Signed)
It was great meeting you today  Your strep test is negative. Your sore throat is likely caused by a virus.   Gargle with the magic mouth wash for 15 -20 seconds - this will help with the pain  You can also do warm salt water gargles.   Take ibuprofen every 6-8 hours as needed  For the fluid behind the ears, you can use Flonase and claritin. If needed Afrin will be good but for no longer than 2 days.   Follow up if no improvement.

## 2015-09-13 NOTE — Progress Notes (Signed)
Subjective:    Patient ID: Katie Richards, female    DOB: November 16, 1955, 60 y.o.   MRN: [id number]  Sore Throat  This is a new problem. The current episode started in the past 7 days. The problem has been gradually worsening. The pain is worse on the left side. There has been no fever. Associated symptoms include ear pain, headaches and a plugged ear sensation. Pertinent negatives include no congestion, coughing, diarrhea, ear discharge, hoarse voice, neck pain, shortness of breath or trouble swallowing. She has had no exposure to strep or mono. She has tried NSAIDs for the symptoms. The treatment provided mild relief.  Otalgia  There is pain in both ears. This is a new problem. The current episode started yesterday. The problem has been unchanged. There has been no fever. Associated symptoms include headaches and a sore throat. Pertinent negatives include no coughing, diarrhea, ear discharge, hearing loss, neck pain or rhinorrhea. There is no history of a chronic ear infection, hearing loss or a tympanostomy tube.    Review of Systems  HENT: Positive for ear pain, postnasal drip and sore throat. Negative for congestion, ear discharge, hearing loss, hoarse voice, rhinorrhea, sinus pressure, sneezing and trouble swallowing.   Respiratory: Negative for cough and shortness of breath.   Cardiovascular: Negative.   Gastrointestinal: Negative for diarrhea.  Musculoskeletal: Negative for neck pain.  Neurological: Positive for headaches. Negative for weakness and light-headedness.       Objective:   Physical Exam  Constitutional: She is oriented to person, place, and time. She appears well-developed and well-nourished. No distress.  HENT:  Head: Normocephalic and atraumatic.  Right Ear: Hearing, external ear and ear canal normal. Tympanic membrane is bulging. Tympanic membrane is not erythematous.  Left Ear: External ear normal. Tympanic membrane is bulging. Tympanic membrane is not  erythematous. Decreased hearing is noted.  Nose: Nose normal. No mucosal edema or rhinorrhea.  Mouth/Throat: Uvula is midline. Posterior oropharyngeal erythema present. No oropharyngeal exudate, posterior oropharyngeal edema or tonsillar abscesses.  Fluid behind bilateral ears. Does not appear to be infected.   Eyes: Conjunctivae and EOM are normal. Pupils are equal, round, and reactive to light. Right eye exhibits no discharge. Left eye exhibits no discharge. No scleral icterus.  Neck: Normal range of motion. Neck supple.  Cardiovascular: Normal rate, regular rhythm, normal heart sounds and intact distal pulses.  Exam reveals no gallop and no friction rub.   No murmur heard. Pulmonary/Chest: Effort normal and breath sounds normal. No respiratory distress. She has no wheezes. She has no rales. She exhibits no tenderness.  Lymphadenopathy:    She has no cervical adenopathy.  Neurological: She is alert and oriented to person, place, and time.  Skin: Skin is warm and dry. No rash noted. She is not diaphoretic. No erythema. No pallor.  Psychiatric: She has a normal mood and affect. Her behavior is normal. Judgment and thought content normal.  Nursing note and vitals reviewed.     Assessment & Plan:   1. Sore throat - step nesgative. Likely viral  - magic mouthwash w/lidocaine SOLN; Take 5 mLs by mouth 3 (three) times daily as needed.  Dispense: 180 mL; Refill: 0 - POC Rapid Strep A - Warm salt water gargles - Nsaids Q 8 h PRN - Follow up if no improvement in 2-3 days 2. Otitis media with effusion, bilateral - Non infectious - Flonase and claritin  - Follow up if no improvement in 2-3 days  Dorothyann Peng, NP

## 2016-01-08 ENCOUNTER — Other Ambulatory Visit: Payer: Self-pay | Admitting: Obstetrics

## 2016-01-08 ENCOUNTER — Ambulatory Visit (INDEPENDENT_AMBULATORY_CARE_PROVIDER_SITE_OTHER): Payer: BLUE CROSS/BLUE SHIELD | Admitting: Emergency Medicine

## 2016-01-08 DIAGNOSIS — Z124 Encounter for screening for malignant neoplasm of cervix: Secondary | ICD-10-CM | POA: Diagnosis not present

## 2016-01-08 DIAGNOSIS — N8501 Benign endometrial hyperplasia: Secondary | ICD-10-CM | POA: Diagnosis not present

## 2016-01-08 DIAGNOSIS — Z6839 Body mass index (BMI) 39.0-39.9, adult: Secondary | ICD-10-CM | POA: Diagnosis not present

## 2016-01-08 DIAGNOSIS — N858 Other specified noninflammatory disorders of uterus: Secondary | ICD-10-CM | POA: Diagnosis not present

## 2016-01-08 DIAGNOSIS — Z01419 Encounter for gynecological examination (general) (routine) without abnormal findings: Secondary | ICD-10-CM | POA: Diagnosis not present

## 2016-01-08 DIAGNOSIS — Z23 Encounter for immunization: Secondary | ICD-10-CM | POA: Diagnosis not present

## 2016-03-01 ENCOUNTER — Other Ambulatory Visit: Payer: Self-pay | Admitting: Family Medicine

## 2016-03-01 DIAGNOSIS — Z1231 Encounter for screening mammogram for malignant neoplasm of breast: Secondary | ICD-10-CM

## 2016-04-10 ENCOUNTER — Ambulatory Visit
Admission: RE | Admit: 2016-04-10 | Discharge: 2016-04-10 | Disposition: A | Discharge status: Home / Self Care | Payer: BLUE CROSS/BLUE SHIELD | Source: Ambulatory Visit | Attending: Family Medicine | Admitting: Family Medicine

## 2016-04-10 DIAGNOSIS — Z1231 Encounter for screening mammogram for malignant neoplasm of breast: Secondary | ICD-10-CM

## 2016-05-18 ENCOUNTER — Other Ambulatory Visit: Payer: Self-pay | Admitting: Family Medicine

## 2016-06-13 DIAGNOSIS — N950 Postmenopausal bleeding: Secondary | ICD-10-CM | POA: Diagnosis present

## 2016-06-13 DIAGNOSIS — N8501 Benign endometrial hyperplasia: Secondary | ICD-10-CM | POA: Diagnosis not present

## 2016-06-13 DIAGNOSIS — G43909 Migraine, unspecified, not intractable, without status migrainosus: Secondary | ICD-10-CM | POA: Insufficient documentation

## 2016-06-13 DIAGNOSIS — Z6839 Body mass index (BMI) 39.0-39.9, adult: Secondary | ICD-10-CM | POA: Diagnosis not present

## 2016-07-23 ENCOUNTER — Other Ambulatory Visit: Payer: Self-pay | Admitting: Family Medicine

## 2016-07-24 ENCOUNTER — Other Ambulatory Visit: Payer: BLUE CROSS/BLUE SHIELD

## 2016-07-31 ENCOUNTER — Encounter: Payer: Self-pay | Admitting: Family Medicine

## 2016-07-31 ENCOUNTER — Ambulatory Visit (INDEPENDENT_AMBULATORY_CARE_PROVIDER_SITE_OTHER): Payer: BLUE CROSS/BLUE SHIELD | Admitting: Family Medicine

## 2016-07-31 VITALS — BP 144/80 | Temp 97.8°F | Ht 63.0 in | Wt 212.0 lb

## 2016-07-31 DIAGNOSIS — Z0001 Encounter for general adult medical examination with abnormal findings: Secondary | ICD-10-CM | POA: Diagnosis not present

## 2016-07-31 DIAGNOSIS — I10 Essential (primary) hypertension: Secondary | ICD-10-CM | POA: Diagnosis not present

## 2016-07-31 DIAGNOSIS — I872 Venous insufficiency (chronic) (peripheral): Secondary | ICD-10-CM

## 2016-07-31 DIAGNOSIS — Z0000 Encounter for general adult medical examination without abnormal findings: Secondary | ICD-10-CM

## 2016-07-31 DIAGNOSIS — M1A00X0 Idiopathic chronic gout, unspecified site, without tophus (tophi): Secondary | ICD-10-CM

## 2016-07-31 LAB — CBC WITH DIFFERENTIAL/PLATELET
Basophils Absolute: 0 10*3/uL (ref 0.0–0.1)
Basophils Relative: 0.5 % (ref 0.0–3.0)
Eosinophils Absolute: 0.1 10*3/uL (ref 0.0–0.7)
Eosinophils Relative: 1.7 % (ref 0.0–5.0)
HCT: 41.4 % (ref 36.0–46.0)
Hemoglobin: 13.4 g/dL (ref 12.0–15.0)
Lymphocytes Relative: 37.4 % (ref 12.0–46.0)
Lymphs Abs: 1.4 10*3/uL (ref 0.7–4.0)
MCHC: 32.4 g/dL (ref 30.0–36.0)
MCV: 86 fl (ref 78.0–100.0)
Monocytes Absolute: 0.3 10*3/uL (ref 0.1–1.0)
Monocytes Relative: 7.2 % (ref 3.0–12.0)
Neutro Abs: 2 10*3/uL (ref 1.4–7.7)
Neutrophils Relative %: 53.2 % (ref 43.0–77.0)
Platelets: 177 10*3/uL (ref 150.0–400.0)
RBC: 4.82 Mil/uL (ref 3.87–5.11)
RDW: 14.3 % (ref 11.5–15.5)
WBC: 3.8 10*3/uL — ABNORMAL LOW (ref 4.0–10.5)

## 2016-07-31 LAB — BASIC METABOLIC PANEL
BUN: 18 mg/dL (ref 6–23)
CO2: 27 mEq/L (ref 19–32)
Calcium: 9.8 mg/dL (ref 8.4–10.5)
Chloride: 106 mEq/L (ref 96–112)
Creatinine, Ser: 0.95 mg/dL (ref 0.40–1.20)
GFR: 77.27 mL/min (ref >60.00)
Glucose, Bld: 88 mg/dL (ref 70–99)
Potassium: 4.3 mEq/L (ref 3.5–5.1)
Sodium: 143 mEq/L (ref 135–145)

## 2016-07-31 LAB — POCT URINALYSIS DIPSTICK
Bilirubin, UA: NEGATIVE
Blood, UA: NEGATIVE
Glucose, UA: NEGATIVE
Ketones, UA: NEGATIVE
Leukocytes, UA: NEGATIVE
Nitrite, UA: NEGATIVE
Protein, UA: NEGATIVE
Spec Grav, UA: 1.01 (ref 1.010–1.025)
Urobilinogen, UA: 0.2 E.U./dL
pH, UA: 6 (ref 5.0–8.0)

## 2016-07-31 LAB — LIPID PANEL
Cholesterol: 200 mg/dL (ref 0–200)
HDL: 71.6 mg/dL (ref >39.00)
LDL Cholesterol: 113 mg/dL — ABNORMAL HIGH (ref 0–99)
NonHDL: 128.82
Total CHOL/HDL Ratio: 3
Triglycerides: 78 mg/dL (ref 0.0–149.0)
VLDL: 15.6 mg/dL (ref 0.0–40.0)

## 2016-07-31 LAB — TSH: TSH: 3.51 u[IU]/mL (ref 0.35–4.50)

## 2016-07-31 LAB — HEPATIC FUNCTION PANEL
ALT: 19 U/L (ref 0–35)
AST: 19 U/L (ref 0–37)
Albumin: 4.5 g/dL (ref 3.5–5.2)
Alkaline Phosphatase: 56 U/L (ref 39–117)
Bilirubin, Direct: 0.1 mg/dL (ref 0.0–0.3)
Total Bilirubin: 0.4 mg/dL (ref 0.2–1.2)
Total Protein: 7.3 g/dL (ref 6.0–8.3)

## 2016-07-31 MED ORDER — ALLOPURINOL 100 MG PO TABS: 100.0000 mg | ORAL_TABLET | Freq: Every day | ORAL | 4 refills | Status: DC

## 2016-07-31 MED ORDER — ATENOLOL 25 MG PO TABS: 25.0000 mg | ORAL_TABLET | Freq: Every day | ORAL | 4 refills | Status: DC

## 2016-07-31 MED ORDER — FUROSEMIDE 20 MG PO TABS: 20.0000 mg | ORAL_TABLET | Freq: Every morning | ORAL | 4 refills | Status: DC

## 2016-07-31 MED ORDER — POTASSIUM CHLORIDE CRYS ER 20 MEQ PO TBCR: 40.0000 meq | EXTENDED_RELEASE_TABLET | Freq: Every day | ORAL | 4 refills | Status: DC

## 2016-07-31 NOTE — Progress Notes (Signed)
Katie Richards is a 61 -year-old married female nonsmoker who comes in today for general physical examination because a history of hypertension  She takes Tenormin 25 mg daily along with 20 mg of Lasix and to 20 mEq potassium tablets daily. She forgot her blood pressure medication this morning. BP 144/80. She's not monitoring her blood pressure at home. [place] to monitor blood pressure at home for 3 weeks and then send [place] the data to be sure blood pressures normal.  She gets routine eye care, dental care, BSE monthly, and you mammography, colonoscopy 2009 normal  Vaccinations up-to-date  Review of systems pertinent she sees her GYN and yearly basis once year she'll have some spotting. She's had numerous biopsies and ultrasounds all of which were normal.  She's had pain in both feet for the past 7 years. 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 She points to the arch of both feet as a source of her pain. I recommend she see Dr. Wylene Simmer for consult  Weight is up to 212. 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  14 point review of systems reviewed and otherwise negative  BP (!) 144/80 (BP Location: Left Arm, Patient Position: Sitting, Cuff Size: Large)   Temp 97.8 F (36.6 C) (Oral)   Ht 5\' 3"  (1.6 m)   Wt 212 lb (96.2 kg)   BMI 37.55 kg/m  Examination HEENT were negative neck was supple thyroid not enlarged no carotid bruits cardiopulmonary exam normal breast exam normal Dahms exam normal except for scar the umbilicus from previous bilateral tubal ligation. Pelvic and rectal by GYN therefore deferred extremities normal skin no peripheral pulses normal  #1 hypertension not at goal........Marland Kitchen recommend she take her medication daily.......... BP check daily for 3 weeks.....Marland Kitchen contact us at BP not normal 135/85 or less  #2 overweight........  diet exercise and weight loss  #3 history of gout........ continue allopurinol  #4 bilateral foot pain........Marland Kitchen refer to Dr. Wylene Simmer for evaluation  #5 dysfunction uterine bleeding....... continue GYN follow-up.

## 2016-07-31 NOTE — Patient Instructions (Addendum)
Carbohydrate free diet,,,,,,,,,, some type of exercise daily,,,,,,,,, goal to lose 12 pounds in the next 12 months  Call [facility] and asked to see Dr. Wylene Simmer for evaluation of the pain in your feet  Take your blood pressure medication daily  Check your blood pressure daily in the morning for 3 weeks,,,,,,,,,,, BP goal 135/85 or less,,,,,,,, if not at goal contact us via my chart so we can adjust your medication to gets her blood pressure back to normal.  Return in one year for general physical exam sooner if any problems  Labs today..........Marland Kitchen we will call you if there is anything abnormal

## 2016-07-31 NOTE — Progress Notes (Signed)
Pre visit review using our clinic review tool, if applicable. No additional management support is needed unless otherwise documented below in the visit note. 

## 2017-01-12 DIAGNOSIS — Z23 Encounter for immunization: Secondary | ICD-10-CM | POA: Diagnosis not present

## 2017-01-17 DIAGNOSIS — Z01419 Encounter for gynecological examination (general) (routine) without abnormal findings: Secondary | ICD-10-CM | POA: Diagnosis not present

## 2017-01-17 DIAGNOSIS — Z6838 Body mass index (BMI) 38.0-38.9, adult: Secondary | ICD-10-CM | POA: Diagnosis not present

## 2017-03-20 ENCOUNTER — Other Ambulatory Visit: Payer: Self-pay | Admitting: Family Medicine

## 2017-03-20 DIAGNOSIS — Z1231 Encounter for screening mammogram for malignant neoplasm of breast: Secondary | ICD-10-CM

## 2017-04-17 ENCOUNTER — Ambulatory Visit
Admission: RE | Admit: 2017-04-17 | Discharge: 2017-04-17 | Disposition: A | Discharge status: Home / Self Care | Payer: BLUE CROSS/BLUE SHIELD | Source: Ambulatory Visit | Attending: Family Medicine | Admitting: Family Medicine

## 2017-04-17 DIAGNOSIS — Z1231 Encounter for screening mammogram for malignant neoplasm of breast: Secondary | ICD-10-CM | POA: Diagnosis not present

## 2017-04-21 DIAGNOSIS — Z6839 Body mass index (BMI) 39.0-39.9, adult: Secondary | ICD-10-CM | POA: Diagnosis not present

## 2017-04-21 DIAGNOSIS — N950 Postmenopausal bleeding: Secondary | ICD-10-CM | POA: Diagnosis not present

## 2017-06-09 NOTE — Patient Instructions (Addendum)
Your procedure is scheduled on:  Wednesday, March 13  Enter through the [employer] [facility] at: 9 am  Pick up the phone at the desk and dial [phone].  Call this number if you have problems the morning of surgery: [phone].  Remember: Do NOT eat Do NOT drink clear liquids (including water) after midnight Tuesday.  Take these medicines the morning of surgery with a SIP OF WATER:  allopurinol and atenolol  Stop herbal medications and supplements at this time.  Do NOT wear jewelry (body piercing), metal hair clips/bobby pins, make-up, or nail polish. Do NOT wear lotions, powders, or perfumes.  You may wear deoderant. Do NOT shave for 48 hours prior to surgery. Do NOT bring valuables to the hospital.  Leave suitcase in car.  After surgery it may be brought to your room.  For patients admitted to the hospital, checkout time is 11:00 AM the day of discharge. Have a responsible adult drive you home and stay with you for 24 hours after your procedure.  Home with Husband Percell Miller cell [phone].

## 2017-06-13 ENCOUNTER — Other Ambulatory Visit: Payer: Self-pay

## 2017-06-13 ENCOUNTER — Encounter (HOSPITAL_COMMUNITY): Payer: Self-pay

## 2017-06-13 ENCOUNTER — Encounter (HOSPITAL_COMMUNITY)
Admission: RE | Admit: 2017-06-13 | Discharge: 2017-06-13 | Disposition: A | Discharge status: Home / Self Care | Payer: BLUE CROSS/BLUE SHIELD | Source: Ambulatory Visit | Attending: Obstetrics | Admitting: Obstetrics

## 2017-06-13 DIAGNOSIS — Z01812 Encounter for preprocedural laboratory examination: Secondary | ICD-10-CM | POA: Diagnosis not present

## 2017-06-13 DIAGNOSIS — Z01810 Encounter for preprocedural cardiovascular examination: Secondary | ICD-10-CM | POA: Diagnosis not present

## 2017-06-13 HISTORY — DX: Headache: R51

## 2017-06-13 HISTORY — DX: Headache, unspecified: R51.9

## 2017-06-13 LAB — CBC
HCT: 38.6 % (ref 36.0–46.0)
Hemoglobin: 12.4 g/dL (ref 12.0–15.0)
MCH: 27.6 pg (ref 26.0–34.0)
MCHC: 32.1 g/dL (ref 30.0–36.0)
MCV: 85.8 fL (ref 78.0–100.0)
Platelets: 175 10*3/uL (ref 150–400)
RBC: 4.5 MIL/uL (ref 3.87–5.11)
RDW: 14.8 % (ref 11.5–15.5)
WBC: 3.9 10*3/uL — ABNORMAL LOW (ref 4.0–10.5)

## 2017-06-13 LAB — TYPE AND SCREEN
ABO/RH(D): O POS
Antibody Screen: NEGATIVE

## 2017-06-13 LAB — ABO/RH: ABO/RH(D): O POS

## 2017-06-13 LAB — BASIC METABOLIC PANEL
Anion gap: 7 (ref 5–15)
BUN: 16 mg/dL (ref 6–20)
CO2: 22 mmol/L (ref 22–32)
Calcium: 8.8 mg/dL — ABNORMAL LOW (ref 8.9–10.3)
Chloride: 109 mmol/L (ref 101–111)
Creatinine, Ser: 0.84 mg/dL (ref 0.44–1.00)
GFR calc Af Amer: >60 mL/min (ref >60)
GFR calc non Af Amer: >60 mL/min (ref >60)
Glucose, Bld: 98 mg/dL (ref 65–99)
Potassium: 3.9 mmol/L (ref 3.5–5.1)
Sodium: 138 mmol/L (ref 135–145)

## 2017-06-25 ENCOUNTER — Other Ambulatory Visit: Payer: Self-pay

## 2017-06-25 ENCOUNTER — Observation Stay (HOSPITAL_COMMUNITY)
Admission: AD | Admit: 2017-06-25 | Discharge: 2017-06-26 | Disposition: A | Discharge status: Home / Self Care | Payer: BLUE CROSS/BLUE SHIELD | Source: Ambulatory Visit | Attending: Obstetrics | Admitting: Obstetrics

## 2017-06-25 ENCOUNTER — Encounter (HOSPITAL_COMMUNITY): Payer: Self-pay | Admitting: Obstetrics

## 2017-06-25 ENCOUNTER — Ambulatory Visit (HOSPITAL_COMMUNITY): Payer: BLUE CROSS/BLUE SHIELD | Admitting: Anesthesiology

## 2017-06-25 ENCOUNTER — Encounter (HOSPITAL_COMMUNITY)
Admission: AD | Disposition: A | Discharge status: Home / Self Care | Payer: Self-pay | Source: Ambulatory Visit | Attending: Obstetrics

## 2017-06-25 DIAGNOSIS — M109 Gout, unspecified: Secondary | ICD-10-CM | POA: Insufficient documentation

## 2017-06-25 DIAGNOSIS — Z79899 Other long term (current) drug therapy: Secondary | ICD-10-CM | POA: Diagnosis not present

## 2017-06-25 DIAGNOSIS — N950 Postmenopausal bleeding: Secondary | ICD-10-CM | POA: Diagnosis not present

## 2017-06-25 DIAGNOSIS — J45909 Unspecified asthma, uncomplicated: Secondary | ICD-10-CM | POA: Diagnosis not present

## 2017-06-25 DIAGNOSIS — D259 Leiomyoma of uterus, unspecified: Secondary | ICD-10-CM | POA: Insufficient documentation

## 2017-06-25 DIAGNOSIS — D649 Anemia, unspecified: Secondary | ICD-10-CM | POA: Diagnosis not present

## 2017-06-25 DIAGNOSIS — Z7982 Long term (current) use of aspirin: Secondary | ICD-10-CM | POA: Diagnosis not present

## 2017-06-25 DIAGNOSIS — Z882 Allergy status to sulfonamides status: Secondary | ICD-10-CM | POA: Diagnosis not present

## 2017-06-25 DIAGNOSIS — I10 Essential (primary) hypertension: Secondary | ICD-10-CM | POA: Insufficient documentation

## 2017-06-25 DIAGNOSIS — N888 Other specified noninflammatory disorders of cervix uteri: Secondary | ICD-10-CM | POA: Diagnosis not present

## 2017-06-25 DIAGNOSIS — N800 Endometriosis of uterus: Secondary | ICD-10-CM | POA: Diagnosis not present

## 2017-06-25 DIAGNOSIS — Z90710 Acquired absence of both cervix and uterus: Secondary | ICD-10-CM

## 2017-06-25 DIAGNOSIS — D485 Neoplasm of uncertain behavior of skin: Secondary | ICD-10-CM | POA: Diagnosis not present

## 2017-06-25 HISTORY — PX: CYSTOSCOPY: SHX5120

## 2017-06-25 HISTORY — PX: BILATERAL SALPINGECTOMY: SHX5743

## 2017-06-25 HISTORY — PX: OOPHORECTOMY: SHX6387

## 2017-06-25 HISTORY — PX: LAPAROSCOPIC ASSISTED VAGINAL HYSTERECTOMY: SHX5398

## 2017-06-25 SURGERY — HYSTERECTOMY, VAGINAL, LAPAROSCOPY-ASSISTED
Anesthesia: General

## 2017-06-25 MED ORDER — SCOPOLAMINE 1 MG/3DAYS TD PT72
1.0000 | MEDICATED_PATCH | Freq: Once | TRANSDERMAL | Status: DC
  Administered 2017-06-25: 1.5 mg via TRANSDERMAL

## 2017-06-25 MED ORDER — LIDOCAINE HCL (PF) 1 % IJ SOLN
INTRAMUSCULAR | Status: AC
  Filled 2017-06-25: qty 5

## 2017-06-25 MED ORDER — DEXAMETHASONE SODIUM PHOSPHATE 4 MG/ML IJ SOLN
INTRAMUSCULAR | Status: AC
  Filled 2017-06-25: qty 1

## 2017-06-25 MED ORDER — POTASSIUM CHLORIDE CRYS ER 20 MEQ PO TBCR
40.0000 meq | EXTENDED_RELEASE_TABLET | Freq: Every day | ORAL | Status: DC
  Administered 2017-06-26: 40 meq via ORAL
  Filled 2017-06-25: qty 2

## 2017-06-25 MED ORDER — KETOROLAC TROMETHAMINE 30 MG/ML IJ SOLN
INTRAMUSCULAR | Status: AC
  Filled 2017-06-25: qty 1

## 2017-06-25 MED ORDER — ACETAMINOPHEN 325 MG PO TABS: 650.0000 mg | ORAL_TABLET | Freq: Four times a day (QID) | ORAL | Status: DC | PRN

## 2017-06-25 MED ORDER — KETOROLAC TROMETHAMINE 30 MG/ML IJ SOLN
INTRAMUSCULAR | Status: DC | PRN
  Administered 2017-06-25: 30 mg via INTRAVENOUS

## 2017-06-25 MED ORDER — FENTANYL CITRATE (PF) 250 MCG/5ML IJ SOLN
INTRAMUSCULAR | Status: AC
  Filled 2017-06-25: qty 5

## 2017-06-25 MED ORDER — ACETAMINOPHEN 160 MG/5ML PO SOLN: 960.0000 mg | Freq: Once | ORAL | Status: AC

## 2017-06-25 MED ORDER — MENTHOL 3 MG MT LOZG
1.0000 | LOZENGE | OROMUCOSAL | Status: DC | PRN
Start: 2017-06-25 — End: 2017-06-26

## 2017-06-25 MED ORDER — PROPOFOL 10 MG/ML IV BOLUS
INTRAVENOUS | Status: DC | PRN
  Administered 2017-06-25: 150 mg via INTRAVENOUS
  Administered 2017-06-25: 20 mg via INTRAVENOUS

## 2017-06-25 MED ORDER — ACETAMINOPHEN 500 MG PO TABS
1000.0000 mg | ORAL_TABLET | Freq: Once | ORAL | Status: AC
  Administered 2017-06-25: 1000 mg via ORAL

## 2017-06-25 MED ORDER — LIDOCAINE HCL (CARDIAC) 20 MG/ML IV SOLN
INTRAVENOUS | Status: DC | PRN
  Administered 2017-06-25: 50 mg via INTRAVENOUS

## 2017-06-25 MED ORDER — SUGAMMADEX SODIUM 200 MG/2ML IV SOLN
INTRAVENOUS | Status: AC
  Filled 2017-06-25: qty 2

## 2017-06-25 MED ORDER — PROPOFOL 10 MG/ML IV BOLUS
INTRAVENOUS | Status: AC
  Filled 2017-06-25: qty 20

## 2017-06-25 MED ORDER — OXYCODONE HCL 5 MG PO TABS: 5.0000 mg | ORAL_TABLET | Freq: Once | ORAL | Status: DC | PRN

## 2017-06-25 MED ORDER — BUPIVACAINE HCL (PF) 0.25 % IJ SOLN
INTRAMUSCULAR | Status: DC | PRN
  Administered 2017-06-25: 3 mL
  Administered 2017-06-25: 10 mL

## 2017-06-25 MED ORDER — SODIUM CHLORIDE 0.9 % IR SOLN
Status: DC | PRN
  Administered 2017-06-25: 3000 mL

## 2017-06-25 MED ORDER — ALLOPURINOL 100 MG PO TABS
100.0000 mg | ORAL_TABLET | Freq: Every day | ORAL | Status: DC
  Administered 2017-06-26: 100 mg via ORAL
  Filled 2017-06-25: qty 1

## 2017-06-25 MED ORDER — VASOPRESSIN 20 UNIT/ML IV SOLN
INTRAVENOUS | Status: AC
  Filled 2017-06-25: qty 1

## 2017-06-25 MED ORDER — ROCURONIUM BROMIDE 100 MG/10ML IV SOLN
INTRAVENOUS | Status: DC | PRN
  Administered 2017-06-25: 50 mg via INTRAVENOUS
  Administered 2017-06-25 (×4): 10 mg via INTRAVENOUS

## 2017-06-25 MED ORDER — ATENOLOL 25 MG PO TABS
25.0000 mg | ORAL_TABLET | Freq: Every day | ORAL | Status: DC
  Administered 2017-06-26: 25 mg via ORAL
  Filled 2017-06-25: qty 1

## 2017-06-25 MED ORDER — SCOPOLAMINE 1 MG/3DAYS TD PT72
MEDICATED_PATCH | TRANSDERMAL | Status: DC
Start: 2017-06-25 — End: 2017-06-26
  Administered 2017-06-25: 1.5 mg via TRANSDERMAL
  Filled 2017-06-25: qty 1

## 2017-06-25 MED ORDER — FENTANYL CITRATE (PF) 100 MCG/2ML IJ SOLN
INTRAMUSCULAR | Status: DC | PRN
  Administered 2017-06-25 (×5): 50 ug via INTRAVENOUS

## 2017-06-25 MED ORDER — LACTATED RINGERS IV SOLN
INTRAVENOUS | Status: DC
  Administered 2017-06-25 (×4): via INTRAVENOUS

## 2017-06-25 MED ORDER — GLYCOPYRROLATE 0.2 MG/ML IJ SOLN
INTRAMUSCULAR | Status: DC | PRN
  Administered 2017-06-25: 0.1 mg via INTRAVENOUS

## 2017-06-25 MED ORDER — MIDAZOLAM HCL 2 MG/2ML IJ SOLN
INTRAMUSCULAR | Status: AC
  Filled 2017-06-25: qty 2

## 2017-06-25 MED ORDER — HYDROMORPHONE HCL 1 MG/ML IJ SOLN: 0.2000 mg | INTRAMUSCULAR | Status: DC | PRN

## 2017-06-25 MED ORDER — SIMETHICONE 80 MG PO CHEW: 80.0000 mg | CHEWABLE_TABLET | Freq: Four times a day (QID) | ORAL | Status: DC | PRN

## 2017-06-25 MED ORDER — OXYCODONE HCL 5 MG PO TABS
5.0000 mg | ORAL_TABLET | ORAL | Status: DC | PRN
  Administered 2017-06-25 – 2017-06-26 (×3): 5 mg via ORAL
  Filled 2017-06-25 (×3): qty 1

## 2017-06-25 MED ORDER — DEXAMETHASONE SODIUM PHOSPHATE 10 MG/ML IJ SOLN
INTRAMUSCULAR | Status: DC | PRN
  Administered 2017-06-25: 4 mg via INTRAVENOUS

## 2017-06-25 MED ORDER — BUPIVACAINE HCL (PF) 0.25 % IJ SOLN
INTRAMUSCULAR | Status: AC
  Filled 2017-06-25: qty 30

## 2017-06-25 MED ORDER — GLYCOPYRROLATE 0.2 MG/ML IJ SOLN
INTRAMUSCULAR | Status: AC
  Filled 2017-06-25: qty 1

## 2017-06-25 MED ORDER — MIDAZOLAM HCL 2 MG/2ML IJ SOLN
INTRAMUSCULAR | Status: DC | PRN
  Administered 2017-06-25: 1 mg via INTRAVENOUS

## 2017-06-25 MED ORDER — FAMOTIDINE 20 MG PO TABS
ORAL_TABLET | ORAL | Status: AC
  Filled 2017-06-25: qty 1

## 2017-06-25 MED ORDER — HYDROMORPHONE HCL 1 MG/ML IJ SOLN
INTRAMUSCULAR | Status: AC
  Filled 2017-06-25: qty 1

## 2017-06-25 MED ORDER — PROMETHAZINE HCL 25 MG/ML IJ SOLN: 6.2500 mg | INTRAMUSCULAR | Status: DC | PRN

## 2017-06-25 MED ORDER — SODIUM CHLORIDE 0.9 % IV SOLN
INTRAVENOUS | Status: DC
  Administered 2017-06-25 – 2017-06-26 (×2): via INTRAVENOUS

## 2017-06-25 MED ORDER — FUROSEMIDE 10 MG/ML IJ SOLN
INTRAMUSCULAR | Status: DC | PRN
  Administered 2017-06-25: 5 mg via INTRAMUSCULAR

## 2017-06-25 MED ORDER — ONDANSETRON HCL 4 MG PO TABS: 4.0000 mg | ORAL_TABLET | Freq: Four times a day (QID) | ORAL | Status: DC | PRN

## 2017-06-25 MED ORDER — IBUPROFEN 600 MG PO TABS
600.0000 mg | ORAL_TABLET | Freq: Four times a day (QID) | ORAL | Status: DC
  Administered 2017-06-26 (×3): 600 mg via ORAL
  Filled 2017-06-25 (×3): qty 1

## 2017-06-25 MED ORDER — ONDANSETRON HCL 4 MG/2ML IJ SOLN: 4.0000 mg | Freq: Four times a day (QID) | INTRAMUSCULAR | Status: DC | PRN

## 2017-06-25 MED ORDER — FAMOTIDINE 20 MG PO TABS
20.0000 mg | ORAL_TABLET | Freq: Once | ORAL | Status: AC
  Administered 2017-06-25: 20 mg via ORAL

## 2017-06-25 MED ORDER — EPHEDRINE SULFATE 50 MG/ML IJ SOLN
INTRAMUSCULAR | Status: DC | PRN
  Administered 2017-06-25 (×2): 10 mg via INTRAVENOUS

## 2017-06-25 MED ORDER — ONDANSETRON HCL 4 MG/2ML IJ SOLN
INTRAMUSCULAR | Status: AC
  Filled 2017-06-25: qty 2

## 2017-06-25 MED ORDER — OXYCODONE HCL 5 MG/5ML PO SOLN: 5.0000 mg | Freq: Once | ORAL | Status: DC | PRN

## 2017-06-25 MED ORDER — CEFAZOLIN SODIUM-DEXTROSE 2-4 GM/100ML-% IV SOLN
2.0000 g | INTRAVENOUS | Status: AC
  Administered 2017-06-25 (×2): 2 g via INTRAVENOUS

## 2017-06-25 MED ORDER — EPHEDRINE 5 MG/ML INJ
INTRAVENOUS | Status: AC
Start: 2017-06-25 — End: ?
  Filled 2017-06-25: qty 10

## 2017-06-25 MED ORDER — ACETAMINOPHEN 500 MG PO TABS
ORAL_TABLET | ORAL | Status: AC
  Filled 2017-06-25: qty 2

## 2017-06-25 MED ORDER — HYDROMORPHONE HCL 1 MG/ML IJ SOLN
0.2500 mg | INTRAMUSCULAR | Status: DC | PRN
  Administered 2017-06-25 (×2): 0.5 mg via INTRAVENOUS

## 2017-06-25 MED ORDER — SODIUM CHLORIDE 0.9 % IJ SOLN
INTRAMUSCULAR | Status: AC
  Filled 2017-06-25: qty 100

## 2017-06-25 MED ORDER — SUGAMMADEX SODIUM 200 MG/2ML IV SOLN
INTRAVENOUS | Status: DC | PRN
  Administered 2017-06-25: 196 mg via INTRAVENOUS

## 2017-06-25 MED ORDER — ROCURONIUM BROMIDE 100 MG/10ML IV SOLN
INTRAVENOUS | Status: AC
  Filled 2017-06-25: qty 1

## 2017-06-25 MED ORDER — ONDANSETRON HCL 4 MG/2ML IJ SOLN
INTRAMUSCULAR | Status: DC | PRN
  Administered 2017-06-25: 4 mg via INTRAVENOUS

## 2017-06-25 SURGICAL SUPPLY — 83 items
ADH SKN CLS APL DERMABOND .7 (GAUZE/BANDAGES/DRESSINGS) ×4
APL SRG 38 LTWT LNG FL B (MISCELLANEOUS) ×4
APPLICATOR ARISTA FLEXITIP XL (MISCELLANEOUS) ×1 IMPLANT
BARRIER ADHS 3X4 INTERCEED (GAUZE/BANDAGES/DRESSINGS) IMPLANT
BRR ADH 4X3 ABS CNTRL BYND (GAUZE/BANDAGES/DRESSINGS)
CABLE HIGH FREQUENCY MONO STRZ (ELECTRODE) ×6 IMPLANT
CANISTER SUCT 3000ML (MISCELLANEOUS) ×1 IMPLANT
COUNTER NEEDLE 1200 MAGNETIC (NEEDLE) ×1 IMPLANT
COVER BACK TABLE 60X90IN (DRAPES) ×5 IMPLANT
COVER MAYO STAND STRL (DRAPES) ×5 IMPLANT
COVER TABLE BACK 60X90 (DRAPES) ×1 IMPLANT
DECANTER SPIKE VIAL GLASS SM (MISCELLANEOUS) ×10 IMPLANT
DERMABOND ADVANCED (GAUZE/BANDAGES/DRESSINGS) ×1
DERMABOND ADVANCED .7 DNX12 (GAUZE/BANDAGES/DRESSINGS) ×4 IMPLANT
DISSECTOR BLUNT TIP ENDO 5MM (MISCELLANEOUS) ×1 IMPLANT
DRAPE SHEET LG 3/4 BI-LAMINATE (DRAPES) ×2 IMPLANT
DRSG OPSITE POSTOP 3X4 (GAUZE/BANDAGES/DRESSINGS) ×5 IMPLANT
DURAPREP 26ML APPLICATOR (WOUND CARE) ×5 IMPLANT
ELECT BLADE 6.5 EXT (BLADE) ×2 IMPLANT
ELECT REM PT RETURN 9FT ADLT (ELECTROSURGICAL) ×5
ELECTRODE REM PT RTRN 9FT ADLT (ELECTROSURGICAL) ×4 IMPLANT
FILTER SMOKE EVAC LAPAROSHD (FILTER) ×5 IMPLANT
GAUZE 4X4 16PLY RFD (DISPOSABLE) ×1 IMPLANT
GLOVE BIO SURGEON STRL SZ7 (GLOVE) ×5 IMPLANT
GLOVE BIOGEL PI IND STRL 6.5 (GLOVE) ×12 IMPLANT
GLOVE BIOGEL PI IND STRL 7.0 (GLOVE) ×8 IMPLANT
GLOVE BIOGEL PI INDICATOR 6.5 (GLOVE) ×3
GLOVE BIOGEL PI INDICATOR 7.0 (GLOVE) ×2
GLOVE ECLIPSE 6.0 STRL STRAW (GLOVE) ×10 IMPLANT
GOWN STRL REUS W/ TWL LRG LVL3 (GOWN DISPOSABLE) ×12 IMPLANT
GOWN STRL REUS W/TWL LRG LVL3 (GOWN DISPOSABLE) ×15
HEMOSTAT ARISTA ABSORB 3G PWDR (MISCELLANEOUS) ×1 IMPLANT
LEGGING LITHOTOMY PAIR STRL (DRAPES) ×5 IMPLANT
LIGASURE LAP L-HOOKWIRE 5 44CM (INSTRUMENTS) ×1 IMPLANT
LIGASURE VESSEL 5MM BLUNT TIP (ELECTROSURGICAL) ×5 IMPLANT
MANIPULATOR ADVINCU DEL 2.5 PL (MISCELLANEOUS) ×1 IMPLANT
MANIPULATOR ADVINCU DEL 3.0 PL (MISCELLANEOUS) IMPLANT
MANIPULATOR ADVINCU DEL 3.5 PL (MISCELLANEOUS) IMPLANT
MANIPULATOR ADVINCU DEL 4.0 PL (MISCELLANEOUS) IMPLANT
NS IRRIG 1000ML POUR BTL (IV SOLUTION) ×5 IMPLANT
PACK LAPAROSCOPY BASIN (CUSTOM PROCEDURE TRAY) ×5 IMPLANT
PACK LAVH (CUSTOM PROCEDURE TRAY) ×4 IMPLANT
PACK ROBOTIC GOWN (GOWN DISPOSABLE) ×5 IMPLANT
PACK TRENDGUARD 450 HYBRID PRO (MISCELLANEOUS) IMPLANT
PACK TRENDGUARD 600 HYBRD PROC (MISCELLANEOUS) IMPLANT
PENCIL BUTTON HOLSTER BLD 10FT (ELECTRODE) ×1 IMPLANT
PROTECTOR NERVE ULNAR (MISCELLANEOUS) ×10 IMPLANT
SCISSORS LAP 5X35 DISP (ENDOMECHANICALS) ×6 IMPLANT
SET CYSTO W/LG BORE CLAMP LF (SET/KITS/TRAYS/PACK) ×1 IMPLANT
SET IRRIG TUBING LAPAROSCOPIC (IRRIGATION / IRRIGATOR) ×5 IMPLANT
SET TRI-LUMEN FLTR TB AIRSEAL (TUBING) IMPLANT
SLEEVE SURGEON STRL (DRAPES) ×1 IMPLANT
SLEEVE XCEL OPT CAN 5 100 (ENDOMECHANICALS) ×5 IMPLANT
SPONGE LAP 4X18 RFD (DISPOSABLE) ×1 IMPLANT
SUT MNCRL AB 3-0 PS2 27 (SUTURE) ×10 IMPLANT
SUT MON AB 4-0 PS1 27 (SUTURE) ×5 IMPLANT
SUT VIC AB 0 CT1 18XCR BRD8 (SUTURE) ×8 IMPLANT
SUT VIC AB 0 CT1 27 (SUTURE) ×10
SUT VIC AB 0 CT1 27XBRD ANBCTR (SUTURE) ×8 IMPLANT
SUT VIC AB 0 CT1 8-18 (SUTURE) ×10
SUT VIC AB 2-0 CT1 (SUTURE) IMPLANT
SUT VIC AB 2-0 CT1 27 (SUTURE)
SUT VIC AB 2-0 CT1 TAPERPNT 27 (SUTURE) IMPLANT
SUT VIC AB 2-0 SH 27 (SUTURE)
SUT VIC AB 2-0 SH 27XBRD (SUTURE) IMPLANT
SUT VICRYL 0 TIES 12 18 (SUTURE) ×5 IMPLANT
SUT VICRYL 0 UR6 27IN ABS (SUTURE) ×5 IMPLANT
SUT VLOC 180 0 9IN  GS21 (SUTURE)
SUT VLOC 180 0 9IN GS21 (SUTURE) IMPLANT
SYR 10ML LL (SYRINGE) IMPLANT
SYR 50ML LL SCALE MARK (SYRINGE) ×5 IMPLANT
SYRINGE 60CC LL (MISCELLANEOUS) ×1 IMPLANT
TOWEL OR 17X24 6PK STRL BLUE (TOWEL DISPOSABLE) ×15 IMPLANT
TRAY FOLEY CATH SILVER 14FR (SET/KITS/TRAYS/PACK) ×5 IMPLANT
TRENDGUARD 450 HYBRID PRO PACK (MISCELLANEOUS) ×5
TRENDGUARD 600 HYBRID PROC PK (MISCELLANEOUS)
TROCAR PORT AIRSEAL 5X120 (TROCAR) IMPLANT
TROCAR PORT AIRSEAL 8X120 (TROCAR) IMPLANT
TROCAR XCEL NON-BLD 11X100MML (ENDOMECHANICALS) ×5 IMPLANT
TROCAR XCEL NON-BLD 5MMX100MML (ENDOMECHANICALS) ×5 IMPLANT
TUBING CONNECTING 10 (TUBING) ×1 IMPLANT
WARMER LAPAROSCOPE (MISCELLANEOUS) ×5 IMPLANT
YANKAUER SUCT BULB TIP NO VENT (SUCTIONS) ×6 IMPLANT

## 2017-06-25 NOTE — Brief Op Note (Signed)
06/25/2017  5:55 PM  PATIENT:  Katie Richards  61 y.o. female  PRE-OPERATIVE DIAGNOSIS:  PERSISTANT POSTMENOPAUSAL BLEEDING  POST-OPERATIVE DIAGNOSIS:  PERSISTANT POSTMENOPAUSAL BLEEDING  PROCEDURE:  Procedure(s) with comments: LAPAROSCOPIC ASSISTED VAGINAL HYSTERECTOMY (N/A) BILATERAL SALPINGECTOMY (Bilateral) CYSTOSCOPY OOPHORECTOMY (Left)  SURGEON:  Surgeon(s) and Role:    Jerelyn Charles, MD - Primary    * Bobbye Charleston, MD - Assisting  ANESTHESIA:   general  EBL:  250 mL   BLOOD ADMINISTERED:none  LOCAL MEDICATIONS USED:  MARCAINE     SPECIMEN:  Source of Specimen:  uterus, cervix, bilateral fallopian tubes, left ovary  DISPOSITION OF SPECIMEN:  PATHOLOGY  COUNTS:  YES  TOURNIQUET:  * No tourniquets in log *  DICTATION: .Note written in EPIC  PLAN OF CARE: Admit for overnight observation  PATIENT DISPOSITION:  PACU - hemodynamically stable.   Delay start of Pharmacological VTE agent (>24hrs) due to surgical blood loss or risk of bleeding: not applicable

## 2017-06-25 NOTE — Anesthesia Procedure Notes (Signed)
Procedure Name: Intubation Date/Time: 06/25/2017 11:45 AM Performed by: Bufford Spikes, CRNA Pre-anesthesia Checklist: Patient identified, Emergency Drugs available, Suction available and Patient being monitored Patient Re-evaluated:Patient Re-evaluated prior to induction Oxygen Delivery Method: Circle system utilized Preoxygenation: Pre-oxygenation with 100% oxygen Induction Type: IV induction Ventilation: Mask ventilation without difficulty Laryngoscope Size: 2 and Miller Grade View: Grade II Tube type: Oral Tube size: 7.0 mm Number of attempts: 1 Airway Equipment and Method: Stylet Placement Confirmation: ETT inserted through vocal cords under direct vision,  positive ETCO2 and breath sounds checked- equal and bilateral Secured at: 21 cm Tube secured with: Tape Dental Injury: Teeth and Oropharynx as per pre-operative assessment

## 2017-06-25 NOTE — Transfer of Care (Addendum)
Immediate Anesthesia Transfer of Care Note  Patient: Katie Richards  Procedure(s) Performed: HYSTERECTOMY TOTAL LAPAROSCOPIC (N/A ) LAPAROSCOPIC ASSISTED VAGINAL HYSTERECTOMY (N/A )  Patient Location: PACU  Anesthesia Type:General  Level of Consciousness: awake, alert  and oriented  Airway & Oxygen Therapy: Patient Spontanous Breathing and Patient connected to nasal cannula oxygen  Post-op Assessment: Report given to RN and Post -op Vital signs reviewed and stable  Post vital signs: Reviewed and stable  Last Vitals:  Vitals:   06/25/17 0908  BP: 130/77  Pulse: (!) 50  Resp: 16  Temp: 36.8 C  SpO2: 100%    Last Pain:  Vitals:   06/25/17 0908  TempSrc: Oral      Patients Stated Pain Goal: 3 (30/94/07 6808)  Complications: No apparent anesthesia complications

## 2017-06-25 NOTE — Anesthesia Postprocedure Evaluation (Signed)
Anesthesia Post Note  Patient: Katie Richards  Procedure(s) Performed: LAPAROSCOPIC ASSISTED VAGINAL HYSTERECTOMY (N/A ) BILATERAL SALPINGECTOMY (Bilateral ) CYSTOSCOPY OOPHORECTOMY (Left )     Patient location during evaluation: PACU Anesthesia Type: General Level of consciousness: awake and alert Pain management: pain level controlled Vital Signs Assessment: post-procedure vital signs reviewed and stable Respiratory status: spontaneous breathing, nonlabored ventilation, respiratory function stable and patient connected to nasal cannula oxygen Cardiovascular status: blood pressure returned to baseline and stable Postop Assessment: no apparent nausea or vomiting Anesthetic complications: no    Last Vitals:  Vitals:   06/25/17 1645 06/25/17 1700  BP: 128/77 125/71  Pulse: 64 (!) 58  Resp: 11 10  Temp:    SpO2: 100% 100%    Last Pain:  Vitals:   06/25/17 1700  TempSrc:   PainSc: 3    Pain Goal: Patients Stated Pain Goal: 3 (06/25/17 0908)               Thurmond Butts P Ellender

## 2017-06-25 NOTE — Op Note (Signed)
PRE-OPERATIVE DIAGNOSIS:  PERSISTANT POSTMENOPAUSAL BLEEDING  POST-OPERATIVE DIAGNOSIS:  PERSISTANT POSTMENOPAUSAL BLEEDING  PROCEDURE:  Procedure(s) with comments: LAPAROSCOPIC ASSISTED VAGINAL HYSTERECTOMY (N/A) BILATERAL SALPINGECTOMY (Bilateral) CYSTOSCOPY OOPHORECTOMY (Left)  SURGEON:  Surgeon(s) and Role:    Jerelyn Charles, MD - Primary    Bobbye Charleston, MD - Assisting  ANESTHESIA:   General endotracheal anesthesia, 10 cc of 0.25% Marcaine injected infraumbilically a  BLOOD ADMINISTERED:none  DRAINS: none   SPECIMEN:  Source of Specimen:  uterus, cervix, bilateral tubes and left ovary  DISPOSITION OF SPECIMEN:  PATHOLOGY  OPERATIVE FINDINGS: Fibroid uterus, normal fallopian tubes.  Normal right ovary.  Left ovary normal in appearance, but adherent to uterus and sigmoid colon.  Sigmoid colon with moderate adhesions to posterior uterus and left adnexa. Normal appearing tip of the appendiz  DESCRIPTION OF THE PROCEDURE: The patient was taken to the operating room, where general endotracheal anesthesia was obtained without difficulty. She was placed in the dorsal lithotomy position in [place] and exam under anesthesia was performed, and a small mobile uterus was appreciated. The patient was prepped and draped in the normal sterile fashion. A Foley catheter was inserted sterilely into the bladder. A bivalve speculum was placed into the vagina.  The cervix was serially dilated with Kennon Rounds dilators and the Advincula uterine manipulator was placed without difficulty and secured to the cervix with a stitch.   The speculum was removed from the vagina.   The scalple was used to make a 10 mm vertical incision at the base of the umbilicus. The 10/11 mm Optiview trocar was used to enter the abdomen under direct visualization. Entry was confirmed and the abdomen was insufflated with carbon dioxide. The abdomen was surveyed and findings noted as above. Under direct  visualization, two additional 5-mm ports were placed into the right and left lower quadrant. The uterus was elevated out of the pelvis.  Moderate adhesions of the sigmoid colon to the posterior uterus and left adnexa were noted.  A fibroid uterus was noted.  The right adnexa and righter ureter were visualized and normal.  Due to the anticipated dissection required to free the uterus from bowel, a third 31mm trocar was introduced into the right pelvis under direct visualization.  The laparoscopic scissors were introduced into the abdomen. Approximately 30 minutes of lysis of adhesions were required prior to being able to start the hysterectomy.  A combination of blunt and sharp dissection were used to free the sigmoid colon from the posterior uterus.  Attention was turned to the left adnexa.  The sigmoid was carefully freed from the pelvic side wall and fallopian tube, again with sharp, blunt and hydrodissection.  At this time, the left ovary was noted to be densely adherent to the uterus.  The decision was made to perform a unilateral left oophorectomy.  The LigaSure was then introduced into the abdomen.  The left IP was grasped and the level of the ovary and the LigaSure was used to sequentially clamp, cut, and burn the IP.  The left ureter was not visualized, but the plane of dissection stayed well above the level of the ureter.  The left fallopian tube was freed from the underlying mesosalpinx.  The round ligament was divided and the broad ligament developed to the level of the uterine arteries.   Development of the bladder flap was started, but it proved difficult to obtain the correct plane, so attention was turned to the right side.  The right ureter was identified and  well below the field of dissection.  The right fallopian tube was isolated and cauterized with the LigaSure.  The round ligament and utero-ovarian were then taken in a similar fashion. The anterior leaf of the broad ligament was developed with  the LigaSure and dissected down to the level of the bladder. The posterior leaf was taken in a similar manner.  The uterine arteries were ligated bilaterally at the level of the internal cervical os.  At this time, attention was turned back to the bladder.  The hot scissors and the LigaSure monopolar hook were both used in an attempt to develop a bladder flap.   It proved very challenging to identify dissect the bladder off of the cervix as the planes were difficult to develop.    A posterior colpotomy was started at the level of the Koh ring in the midline between the uterosacrals, but visualization was difficult.  At this time, the decision was made to turn the procedure to a vaginal approach.  All instruments were removed from the abdomen.   Attention was turned to the vagina. The uterine manipulator was removed. The cervix was grasped with a Ardis Hughs tenaculum. Bovie cautery was used to circumferentially incise the cervicovaginal junction. The posterior cul-de-sac was not easily entered, so the decision was made to take the uterosacrals first.  The the uterosacrals were grasped with a pair heney clamps on either side and secured with a Heaney stitch of 0 Vicryl.  The bladder was carefully dissected off of the cervix withsharp dissection with the Metzenbaums.  The posterior peritoneum was entered and  the long billed duckbill retractor was then placed into the peritoneal cavity. Curved Heaneys were obtained to sequentially clamp, cut, and suture ligate working up the cardinal ligaments and uterine arteries until entry could be gained anteriorly into the peritoneum.  One additional Heaney clamp was placed bilaterally and the uterus was freed and removed.  The pedicles were secured with with a free tie of 0-Vicryl.  All pedicles were examined and were hemostatic.  Once hemostasis was achieved the peritoneum was closed in a pursestring fashion including the bilateral uterosacrals and posteriorly in and out of  the vagina and a partial Halbans culdoplasty. The stitch was pulled taut closing the peritoneum and pulling the uterosacrals together in the midline through the modified Halbans.  The vaginal cuff was then closed vertically with a running locked suture of 0-vicryl.  A cystoscopy was performed with demonstrated a normal bladder without defects.  Strong efflux was seen at both ureteral orifices.  The cystoscope was removed and the foley catheter was replaced.    Attention was then turned back to the abdomen. The camera was reinserted. The abdomen was surveyed. It was copiously irrigated.  The pedicles on the left and right side were examined and noted to be hemostatic.  The vaginal cuff was re-examined and was hemostatic.  There was a small defect in an epiploica of the sigmoid colon.  This had minimal bleeding.  It was examined and defect was superficial and clearly limited to the epiploica and did not involve either the bowel serosa or lumen.  The tip of the appendix was seen and normal.  The liver was not well visualized due to the omentum.  Arista was placed at the vaginal cuff, bilaterally along the pedicles, and along the bowel.    At this time, all instruments were removed from the abdomen.  The fascia at the umbilicus was closed with 0-vicryl in a figure of  eight stitch.  The skin incisions were closed with 4-0 Monocryl in a subcuticular fashion followed by Dermabond. The patient tolerated all portions of procedure well. Sponge, lap, and needle count were correct x2.

## 2017-06-25 NOTE — Anesthesia Preprocedure Evaluation (Addendum)
Anesthesia Evaluation  Patient identified by MRN, date of birth, ID band Patient awake    Reviewed: Allergy & Precautions, NPO status , Patient's Chart, lab work & pertinent test results, reviewed documented beta blocker date and time   Airway Mallampati: III  TM Distance: >3 FB Neck ROM: Full    Dental no notable dental hx.    Pulmonary asthma ,    Pulmonary exam normal breath sounds clear to auscultation       Cardiovascular hypertension, Pt. on home beta blockers and Pt. on medications Normal cardiovascular exam Rhythm:Regular Rate:Normal  ECG: SB, rate 55   Neuro/Psych  Headaches, negative psych ROS   GI/Hepatic negative GI ROS, Neg liver ROS,   Endo/Other  negative endocrine ROS  Renal/GU negative Renal ROS     Musculoskeletal Gout   Abdominal (+) + obese,   Peds  Hematology  (+) anemia ,   Anesthesia Other Findings PMB  Reproductive/Obstetrics                           Anesthesia Physical Anesthesia Plan  ASA: III  Anesthesia Plan: General   Post-op Pain Management:    Induction: Intravenous  PONV Risk Score and Plan: 4 or greater and Scopolamine patch - Pre-op, Midazolam, Dexamethasone, Ondansetron and Treatment may vary due to age or medical condition  Airway Management Planned: Oral ETT  Additional Equipment:   Intra-op Plan:   Post-operative Plan: Extubation in OR  Informed Consent: I have reviewed the patients History and Physical, chart, labs and discussed the procedure including the risks, benefits and alternatives for the proposed anesthesia with the patient or authorized representative who has indicated his/her understanding and acceptance.   Dental advisory given  Plan Discussed with: CRNA  Anesthesia Plan Comments:        Anesthesia Quick Evaluation

## 2017-06-25 NOTE — H&P (Signed)
62 y.o. presents for hysterectomy for recurrent postmenopausal bleeding.  She has had 5 episodes of postmenopausal bleeding since 2013. She underwent hysteroscopy with polypectomy in early 2017.  Postmenopausal bleeding has returned twice since that time. She had a gyn scan in 2018 that demonstrated a thin EMS <38mm.  At that time she declined further evaluation / therapy.  PMB again returned in December of 2018. She had daily spotting for approximately 1 month.  EMB showed proliferative endometrium.   Discussed options for further management to include progesterone therapy via daily provera or IUD vs hysterectomy.    Past Medical History:  Diagnosis Date  . Allergic rhinitis   . Anemia    History of  . Arthritis    knees  . Asthma    Childhood,   . Headache   . History of migraine headaches   . Hypertension   . SVD (spontaneous vaginal delivery)    x 3, 2 living    Past Surgical History:  Procedure Laterality Date  . BILATERAL CARPAL TUNNEL RELEASE    . COLONOSCOPY    . HYSTEROSCOPY N/A 04/21/2015   Procedure: HYSTEROSCOPY WITH POLYPECTOMY;  Surgeon: Jerelyn Charles, MD;  Location: [facility];  Service: Gynecology;  Laterality: N/A;  WITH MYOSURE  . reverese tubal ligation    . TUBAL LIGATION      OB History  Gravida Para Term Preterm AB Living  2 2       2   SAB TAB Ectopic Multiple Live Births               # Outcome Date GA Lbr Len/2nd Weight Sex Delivery Anes PTL Lv  2 Para           1 Para               Social History   Socioeconomic History  . Marital status: Married    Spouse name: Not on file  . Number of children: 2  . Years of education: Not on file  . Highest education level: Not on file  Occupational History  . Occupation: [employer]  Tobacco Use  . Smoking status: Never Smoker  . Smokeless tobacco: Never Used  Substance and Sexual Activity  . Alcohol use: Yes    Alcohol/week: 1.2 oz    Types: 2 Glasses of wine per week  . Drug use: No  . Sexual activity:  Not on file  Other Topics Concern  . Not on file  Social History Narrative  . Not on file   Sulfonamide derivatives    Vitals:   06/25/17 0908  BP: 130/77  Pulse: (!) 50  Resp: 16  Temp: 98.2 F (36.8 C)  SpO2: 100%     General:  NAD Abdomen:  Soft Pelvic: uterus enlarged 10 weeks size, mobile     A/P   62 y.o.  Presents for TLH, bilateral salpingectomy, possible cystoscopy for recurrent postmenopausal bleeding.  USO only if grossly abnormal. Discussed risks to include infection, bleeding, damage to surrounding structures (including but not limited to bowel, bladder, ureters, ovaries, nerves, vessels), need for additional procedures, cuff dehiscence, vte/pe, complications of anesthesia, need to convert to laparotomy. All questions answered, consent signed, patient elects to proceed. Ancef preop     [place]

## 2017-06-26 ENCOUNTER — Encounter (HOSPITAL_COMMUNITY): Payer: Self-pay | Admitting: Obstetrics

## 2017-06-26 DIAGNOSIS — Z7982 Long term (current) use of aspirin: Secondary | ICD-10-CM | POA: Diagnosis not present

## 2017-06-26 DIAGNOSIS — N800 Endometriosis of uterus: Secondary | ICD-10-CM | POA: Diagnosis not present

## 2017-06-26 DIAGNOSIS — I10 Essential (primary) hypertension: Secondary | ICD-10-CM | POA: Diagnosis not present

## 2017-06-26 DIAGNOSIS — Z882 Allergy status to sulfonamides status: Secondary | ICD-10-CM | POA: Diagnosis not present

## 2017-06-26 DIAGNOSIS — N888 Other specified noninflammatory disorders of cervix uteri: Secondary | ICD-10-CM | POA: Diagnosis not present

## 2017-06-26 DIAGNOSIS — N950 Postmenopausal bleeding: Secondary | ICD-10-CM | POA: Diagnosis not present

## 2017-06-26 DIAGNOSIS — D259 Leiomyoma of uterus, unspecified: Secondary | ICD-10-CM | POA: Diagnosis not present

## 2017-06-26 DIAGNOSIS — Z79899 Other long term (current) drug therapy: Secondary | ICD-10-CM | POA: Diagnosis not present

## 2017-06-26 DIAGNOSIS — M109 Gout, unspecified: Secondary | ICD-10-CM | POA: Diagnosis not present

## 2017-06-26 DIAGNOSIS — J45909 Unspecified asthma, uncomplicated: Secondary | ICD-10-CM | POA: Diagnosis not present

## 2017-06-26 LAB — CBC
HCT: 29 % — ABNORMAL LOW (ref 36.0–46.0)
Hemoglobin: 9.3 g/dL — ABNORMAL LOW (ref 12.0–15.0)
MCH: 27.5 pg (ref 26.0–34.0)
MCHC: 32.1 g/dL (ref 30.0–36.0)
MCV: 85.8 fL (ref 78.0–100.0)
Platelets: 149 10*3/uL — ABNORMAL LOW (ref 150–400)
RBC: 3.38 MIL/uL — ABNORMAL LOW (ref 3.87–5.11)
RDW: 15.1 % (ref 11.5–15.5)
WBC: 5.9 10*3/uL (ref 4.0–10.5)

## 2017-06-26 MED ORDER — ACETAMINOPHEN 325 MG PO TABS: 650.0000 mg | ORAL_TABLET | Freq: Four times a day (QID) | ORAL | 0 refills | Status: DC | PRN

## 2017-06-26 MED ORDER — FUROSEMIDE 20 MG PO TABS
20.0000 mg | ORAL_TABLET | Freq: Every day | ORAL | Status: DC
  Administered 2017-06-26: 20 mg via ORAL
  Filled 2017-06-26: qty 1

## 2017-06-26 MED ORDER — POLYETHYLENE GLYCOL 3350 17 G PO PACK: 17.0000 g | PACK | Freq: Every day | ORAL | Status: DC | PRN

## 2017-06-26 MED ORDER — OXYCODONE HCL 5 MG PO TABS: 5.0000 mg | ORAL_TABLET | Freq: Four times a day (QID) | ORAL | 0 refills | Status: DC | PRN

## 2017-06-26 NOTE — Progress Notes (Signed)
Doing well. Has voided x 2.  Ambulating without difficulty  Denies nausea / vomiting.  Tolerating PO.    Scant vaginal bleeding.   BP 106/60 (BP Location: Left Arm)   Pulse 66   Temp 98.4 F (36.9 C) (Oral)   Resp 18   Ht 5' 2.5" (1.588 m)   Wt 97.5 kg (215 lb)   SpO2 99%   BMI 38.70 kg/m    POD#1 s/p LAVH / b/l salpingectomy, USO Meeting all goals--will discharge to home today

## 2017-06-26 NOTE — Progress Notes (Signed)
Doing well.  Denies nausea / vomiting.  Tolerating PO.  Has ambulated without dizziness.  Scant vaginal bleeding.  Foley catheter still in place  BP (!) 104/58 (BP Location: Left Arm)   Pulse 69   Temp 98.9 F (37.2 C) (Oral)   Resp 17   Ht 5' 2.5" (1.588 m)   Wt 97.5 kg (215 lb)   SpO2 98%   BMI 38.70 kg/m   NAD Lungs CTAB, heart RRR Abd: soft, mild distension.  + BS.  Incisions c/d/i Pelvic: peripad with scant old blood Ext: trace edema b/l  POD#1 s/p LAVH / b/l salpingectomy, USO Doing well.  D/c fluid, d/c foley catheter Expect d/c this afternoon

## 2017-06-26 NOTE — Discharge Instructions (Signed)
You may wash incision with soap and water.   Do not soak the incision for 2 weeks (no tub baths or swimming).    Keep incision dry. You may shower and cleanse the incisions gently with soap and water.   Pelvic rest x 6 weeks   No lifting over 10 lbs for 6 weeks.   Do not drive until you are not taking narcotic pain medication AND you can comfortably slam on the brakes.  Please start miralax daily for constipation and continue while taking oxycodone.

## 2017-06-26 NOTE — Discharge Summary (Signed)
Physician Discharge Summary  Patient ID: Katie Richards MRN: [id number] DOB/AGE: [birth year] 62 y.o.  Admit date: 06/25/2017 Discharge date: 06/26/2017  Admission Diagnoses: Recurrent postmenopausal bleeding  Discharge Diagnoses:  Active Problems:   Postmenopausal bleeding   Discharged Condition: good  Hospital Course: Patient was taken to the OR and underwent an LAVH/USO/bilateral salpingectomy / LOA for recurrent PMB.  EBL 250.  She was transferred to the floor for her post op course.  On POD#1, she voided without difficulty and had appropriate urine output.  Vital signs were stable.  Her pain was controlled on PO pain meds.  She was able to ambulate well without dizziness and tolerated a regular diet. She had scant vaginal bleeding.  She was deemed stable for discharge to home  Consults: None    Discharge Exam: Blood pressure 106/60, pulse 66, temperature 98.4 F (36.9 C), temperature source Oral, resp. rate 18, height 5' 2.5" (1.588 m), weight 97.5 kg (215 lb), SpO2 99 %.   Disposition: 01-Home or Self Care  Discharge Instructions    Call MD for:  difficulty breathing, headache or visual disturbances   Complete by:  As directed    Call MD for:  extreme fatigue   Complete by:  As directed    Call MD for:  hives   Complete by:  As directed    Call MD for:  persistant dizziness or light-headedness   Complete by:  As directed    Call MD for:  persistant nausea and vomiting   Complete by:  As directed    Call MD for:  redness, tenderness, or signs of infection (pain, swelling, redness, odor or green/yellow discharge around incision site)   Complete by:  As directed    Call MD for:  severe uncontrolled pain   Complete by:  As directed    Call MD for:  temperature >100.4   Complete by:  As directed    Driving restriction    Complete by:  As directed    Avoid driving for at least 1 week and while taking oxycodone.   Lifting restrictions   Complete by:  As directed    Weight restriction of 10 lbs.   No dressing needed   Complete by:  As directed    Sexual acrtivity   Complete by:  As directed    Pelvic rest x 6 weeks (no sex or tampons)     Allergies as of 06/26/2017      Reactions   Sulfonamide Derivatives Hives      Medication List    STOP taking these medications   aspirin 81 MG chewable tablet     TAKE these medications   acetaminophen 325 MG tablet Commonly known as:  TYLENOL Take 2 tablets (650 mg total) by mouth every 6 (six) hours as needed for mild pain.   allopurinol 100 MG tablet Commonly known as:  ZYLOPRIM Take 1 tablet (100 mg total) by mouth daily.   atenolol 25 MG tablet Commonly known as:  TENORMIN Take 1 tablet (25 mg total) by mouth daily.   furosemide 20 MG tablet Commonly known as:  LASIX Take 1 tablet (20 mg total) by mouth every morning.   oxyCODONE 5 MG immediate release tablet Commonly known as:  Oxy IR/ROXICODONE Take 1 tablet (5 mg total) by mouth every 6 (six) hours as needed for severe pain.   potassium chloride SA 20 MEQ tablet Commonly known as:  K-DUR,KLOR-CON Take 2 tablets (40 mEq total) by mouth daily.  Vitamin D 2000 units Caps Take 1 capsule by mouth daily.      Follow-up Information    Jerelyn Charles, MD Follow up in 2 week(s).   Specialty:  Obstetrics Contact information: [address] [place] [address] [phone]           Signed: Beryle Quant GEFFEL Hermelinda Diegel 06/26/2017, 12:06 PM

## 2017-08-06 ENCOUNTER — Ambulatory Visit (INDEPENDENT_AMBULATORY_CARE_PROVIDER_SITE_OTHER): Payer: BLUE CROSS/BLUE SHIELD | Admitting: Family Medicine

## 2017-08-06 ENCOUNTER — Encounter: Payer: Self-pay | Admitting: Family Medicine

## 2017-08-06 VITALS — BP 146/80 | HR 54 | Temp 98.2°F | Ht 62.0 in | Wt 211.0 lb

## 2017-08-06 DIAGNOSIS — Z0000 Encounter for general adult medical examination without abnormal findings: Secondary | ICD-10-CM

## 2017-08-06 DIAGNOSIS — I872 Venous insufficiency (chronic) (peripheral): Secondary | ICD-10-CM | POA: Diagnosis not present

## 2017-08-06 DIAGNOSIS — I10 Essential (primary) hypertension: Secondary | ICD-10-CM | POA: Diagnosis not present

## 2017-08-06 DIAGNOSIS — M1A00X0 Idiopathic chronic gout, unspecified site, without tophus (tophi): Secondary | ICD-10-CM

## 2017-08-06 LAB — CBC WITH DIFFERENTIAL/PLATELET
Basophils Absolute: 0 10*3/uL (ref 0.0–0.1)
Basophils Relative: 0.4 % (ref 0.0–3.0)
Eosinophils Absolute: 0.1 10*3/uL (ref 0.0–0.7)
Eosinophils Relative: 1.9 % (ref 0.0–5.0)
HCT: 38.4 % (ref 36.0–46.0)
Hemoglobin: 12.6 g/dL (ref 12.0–15.0)
Lymphocytes Relative: 35.3 % (ref 12.0–46.0)
Lymphs Abs: 1.4 10*3/uL (ref 0.7–4.0)
MCHC: 32.9 g/dL (ref 30.0–36.0)
MCV: 86.6 fl (ref 78.0–100.0)
Monocytes Absolute: 0.3 10*3/uL (ref 0.1–1.0)
Monocytes Relative: 6.6 % (ref 3.0–12.0)
Neutro Abs: 2.2 10*3/uL (ref 1.4–7.7)
Neutrophils Relative %: 55.8 % (ref 43.0–77.0)
Platelets: 178 10*3/uL (ref 150.0–400.0)
RBC: 4.44 Mil/uL (ref 3.87–5.11)
RDW: 14.8 % (ref 11.5–15.5)
WBC: 3.9 10*3/uL — ABNORMAL LOW (ref 4.0–10.5)

## 2017-08-06 LAB — HEPATIC FUNCTION PANEL
ALT: 18 U/L (ref 0–35)
AST: 19 U/L (ref 0–37)
Albumin: 4.3 g/dL (ref 3.5–5.2)
Alkaline Phosphatase: 54 U/L (ref 39–117)
Bilirubin, Direct: 0.1 mg/dL (ref 0.0–0.3)
Total Bilirubin: 0.5 mg/dL (ref 0.2–1.2)
Total Protein: 7.1 g/dL (ref 6.0–8.3)

## 2017-08-06 LAB — TSH: TSH: 4.33 u[IU]/mL (ref 0.35–4.50)

## 2017-08-06 LAB — LIPID PANEL
Cholesterol: 197 mg/dL (ref 0–200)
HDL: 72.2 mg/dL (ref >39.00)
LDL Cholesterol: 104 mg/dL — ABNORMAL HIGH (ref 0–99)
NonHDL: 124.32
Total CHOL/HDL Ratio: 3
Triglycerides: 101 mg/dL (ref 0.0–149.0)
VLDL: 20.2 mg/dL (ref 0.0–40.0)

## 2017-08-06 MED ORDER — ATENOLOL 25 MG PO TABS: 25.0000 mg | ORAL_TABLET | Freq: Every day | ORAL | 4 refills | Status: DC

## 2017-08-06 MED ORDER — FUROSEMIDE 20 MG PO TABS: 20.0000 mg | ORAL_TABLET | Freq: Every morning | ORAL | 4 refills | Status: DC

## 2017-08-06 MED ORDER — POTASSIUM CHLORIDE CRYS ER 20 MEQ PO TBCR: 40.0000 meq | EXTENDED_RELEASE_TABLET | Freq: Every day | ORAL | 4 refills | Status: DC

## 2017-08-06 MED ORDER — ALLOPURINOL 100 MG PO TABS: 100.0000 mg | ORAL_TABLET | Freq: Every day | ORAL | 4 refills | Status: DC

## 2017-08-06 NOTE — Patient Instructions (Signed)
Continue current medications  Since your blood pressure is normal.....Marland Kitchen check it weekly at home  Start a diet and exercise program as we discussed. Goal to lose 12 pounds in the next 12 months.......... walk 30 minutes daily....... carbohydrate free diet  Return in one year for general physical exam sooner if any problem

## 2017-08-06 NOTE — Progress Notes (Signed)
Katie Richards is a 62 year old married female nonsmoker comes in today for annual physical examination because of a history of hypertension and gout  Her gout prophylaxis she takes allopurinol 100 mg daily.  Her hypertension she takes Tenormin 25 mg, Lasix 20 mg, and 220 mEq potassium tablets. Her BP today is 146/80 however BP at home ranges from 122-130/80. She says her blood pressure always comes up when she comes into the doctor's office  She gets routine eye care, dental care, colonoscopy 2009 December due for follow-up. She does do BSE monthly and annual mammography.  She had her uterus and her left ovary removed 6 weeks ago laparoscopically. She did well and had no complications. Her right ovary was left intact.  Social history..... Married lives here in [place] works for [employer] walks sporadically. Weight is 211. We again discussed diet exercise and weight loss.  Vaccinations up-to-date except information given on shingles vaccine.  14 point review of systems do not otherwise negative  Cardiogram was done prior surgery was normal therefore not repeated.  BP (!) 146/80 (BP Location: Left Arm, Patient Position: Sitting, Cuff Size: Large)   Pulse (!) 54   Temp 98.2 F (36.8 C) (Oral)   Ht 5\' 2"  (1.575 m)   Wt 211 lb (95.7 kg)   BMI 38.59 kg/m  Well-developed well-nourished female no acute distress vital signs stable she's afebrile HEENT were negative neck was supple thyroid is not enlarged no carotid bruits cardiopulmonary exam normal breast exam by GYN therefore not done. Abdominal exam was negative except for healing scars from previous laparoscopy. Pelvic and rectal by GYN therefore deferred extremities normal skin normal peripheral pulses normal  #1 hypertension at goal......... continue current therapy  #2 history gout...Marland KitchenMarland KitchenMarland Kitchen continue allopurinol  #3 overweight............ diet exercise weight loss go to lose 12 pounds in the next 12 months  #4 status post  hysterectomy and right oophorectomy for DU B

## 2018-02-09 ENCOUNTER — Encounter: Payer: Self-pay | Admitting: Gastroenterology

## 2018-02-15 IMAGING — MG 2D DIGITAL SCREENING BILATERAL MAMMOGRAM WITH 3D TOMO WITH CAD
7 series · 8 of 15 positions shown · non-contrast
Comparison: Previous exam(s).

CLINICAL DATA: Screening.

EXAM:
2D DIGITAL SCREENING BILATERAL MAMMOGRAM WITH 3D TOMO WITH CAD

[R CC]
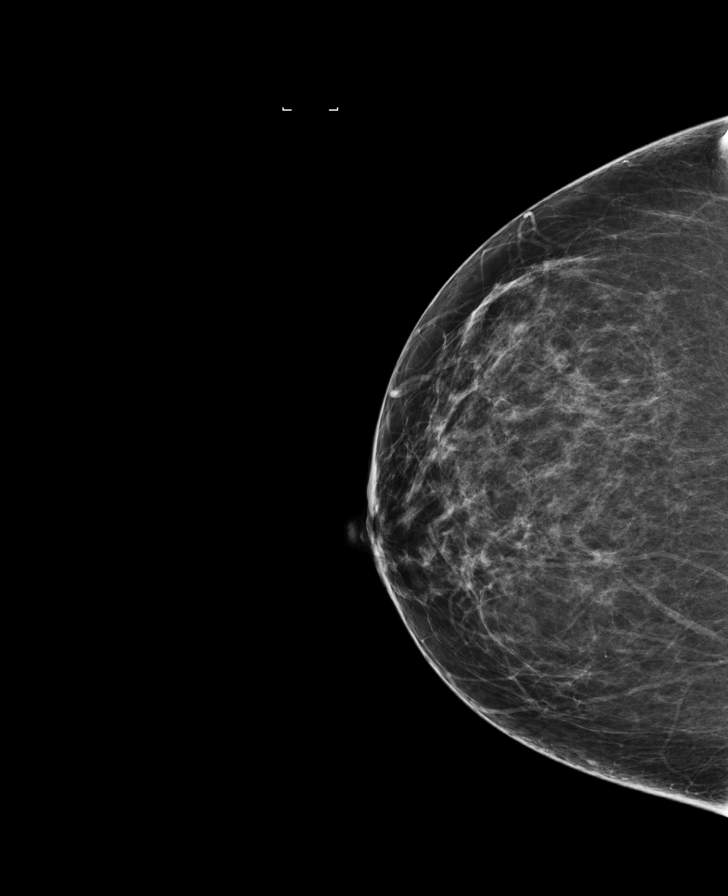

[L CC synth-2D]
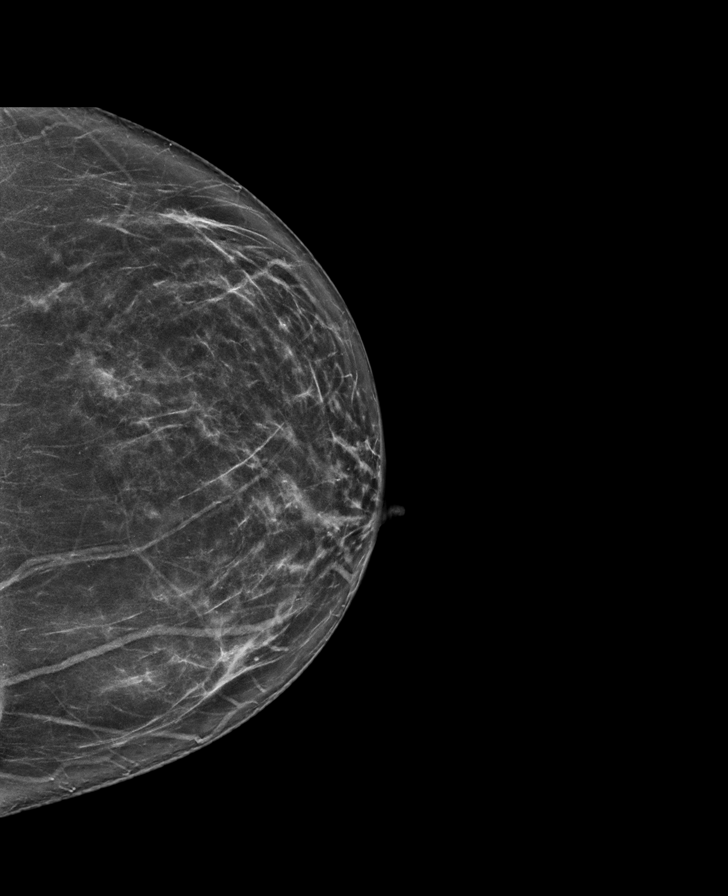

[R MLO synth-2D]
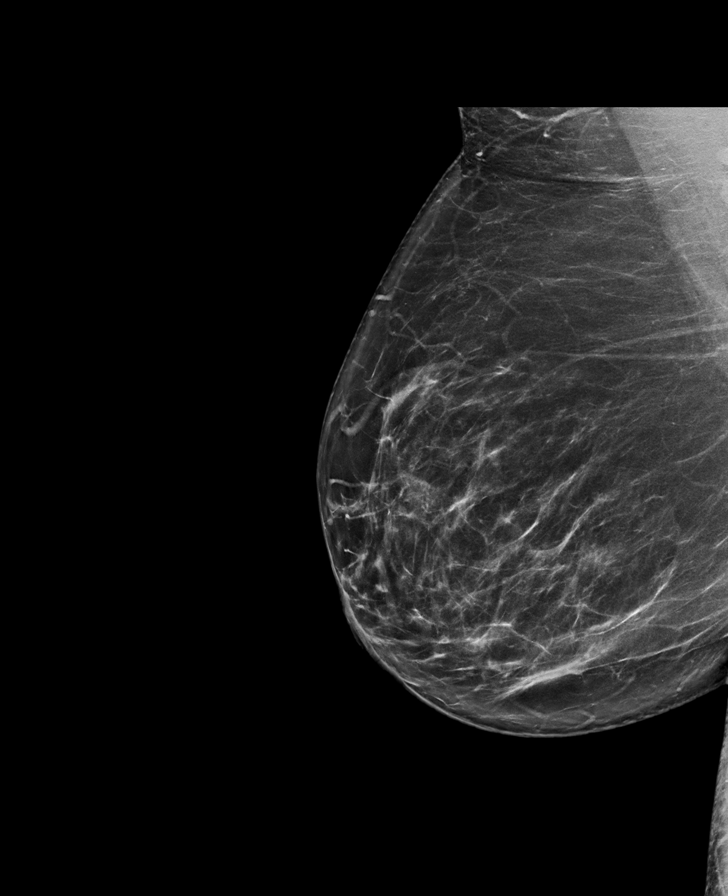

[R MLO]
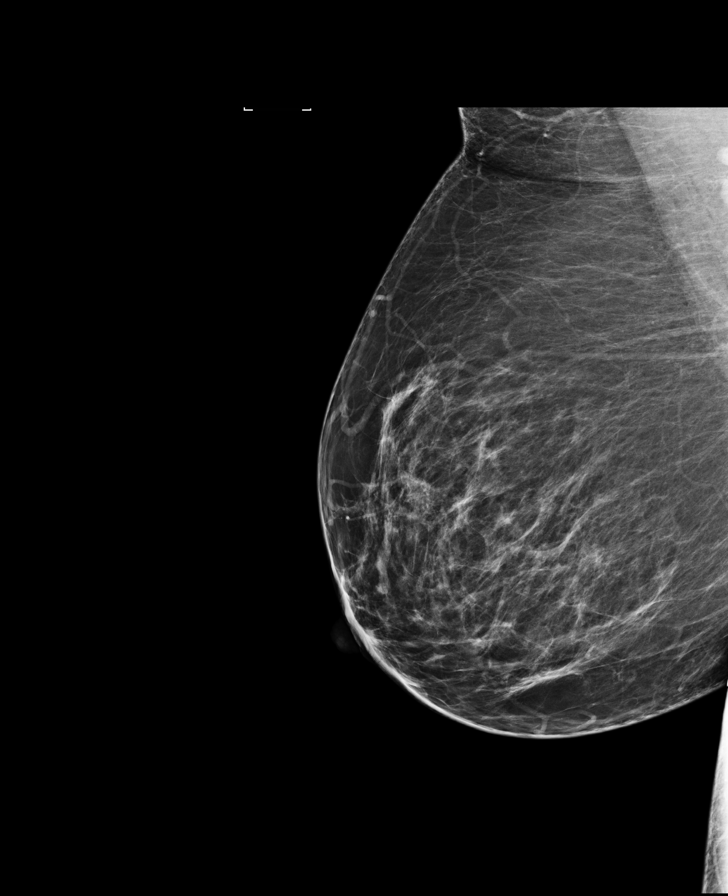

[L CC]
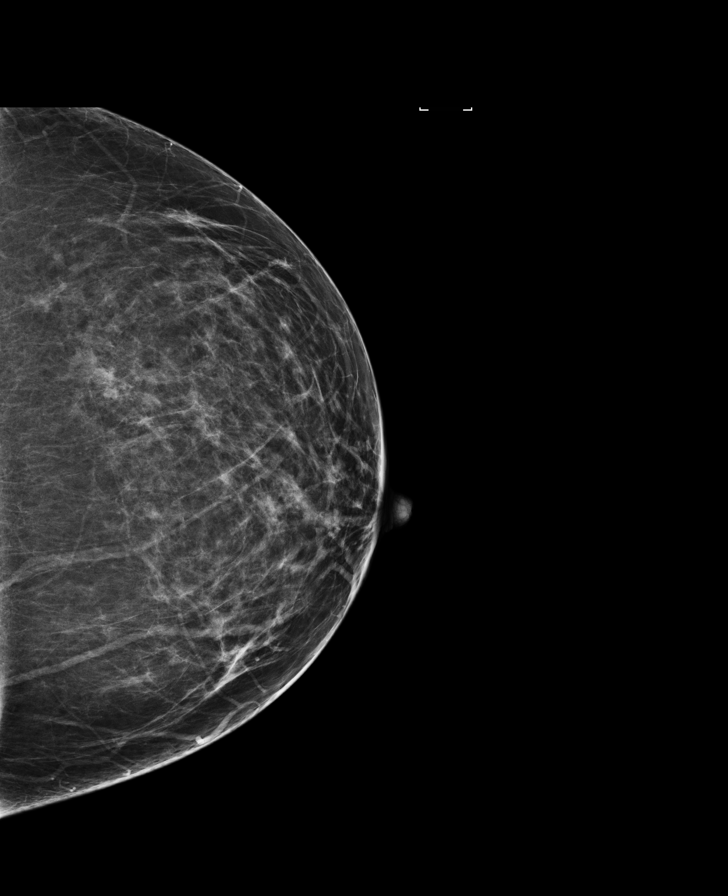

[R MLO tomo · 2 of 85 frames shown]
[frame 28/85]
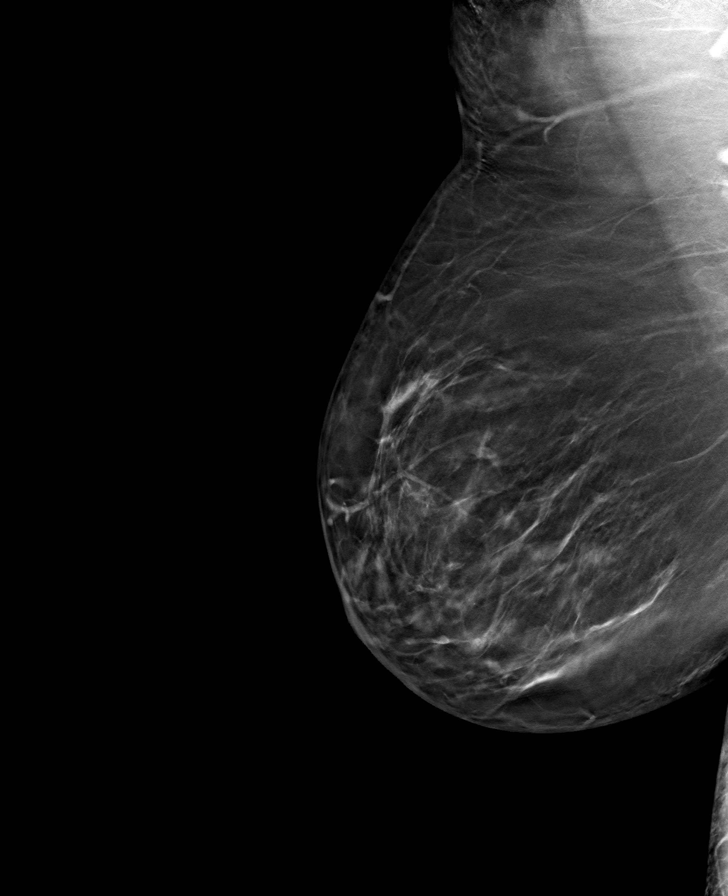
[frame 43/85]
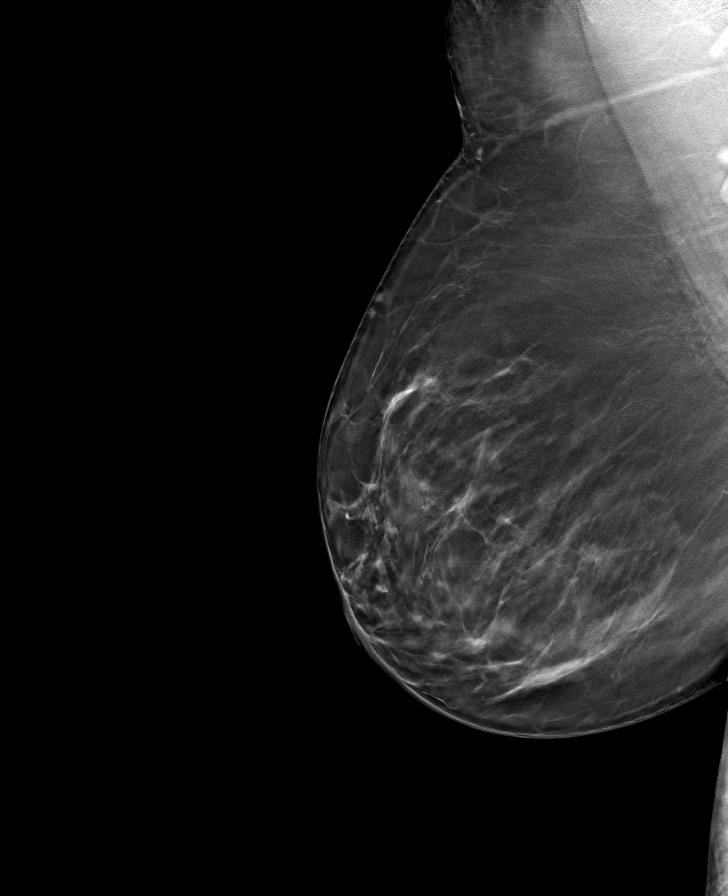

[L MLO tomo · tomo slice 45/88.0]
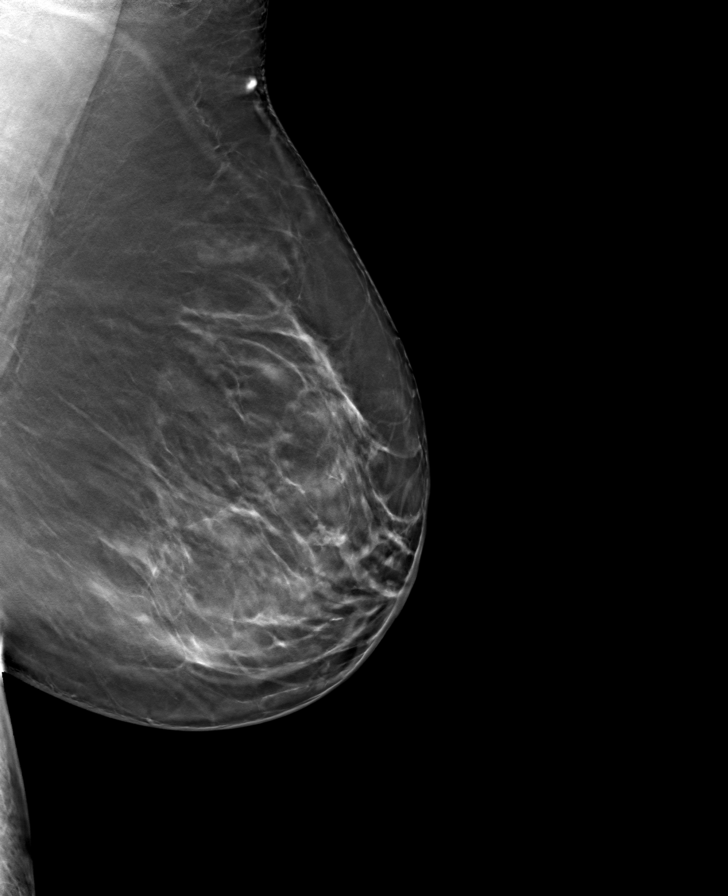

[8 of 15 positions shown; findings below may reference images not displayed]

ACR Breast Density Category b: There are scattered areas of
fibroglandular density.
FINDINGS: There are no findings suspicious for malignancy. Images were
processed with CAD.
IMPRESSION: No mammographic evidence of malignancy. A result letter of this
screening mammogram will be mailed directly to the patient.

RECOMMENDATION:
Screening mammogram in one year. (Code:[id number]-P-ZS0)

BI-RADS CATEGORY  1: Negative.

## 2018-02-20 ENCOUNTER — Ambulatory Visit: Payer: BLUE CROSS/BLUE SHIELD

## 2018-03-20 ENCOUNTER — Ambulatory Visit (AMBULATORY_SURGERY_CENTER): Payer: Self-pay | Admitting: *Deleted

## 2018-03-20 ENCOUNTER — Encounter: Payer: Self-pay | Admitting: Gastroenterology

## 2018-03-20 VITALS — Ht 62.0 in | Wt 215.0 lb

## 2018-03-20 DIAGNOSIS — Z1211 Encounter for screening for malignant neoplasm of colon: Secondary | ICD-10-CM

## 2018-03-20 MED ORDER — PEG 3350-KCL-NA BICARB-NACL 420 G PO SOLR: 4000.0000 mL | Freq: Once | ORAL | 0 refills | Status: AC

## 2018-03-20 NOTE — Progress Notes (Signed)
No egg or soy allergy known to patient  No issues with past sedation with any surgeries  or procedures, no intubation problems  No diet pills per patient No home 02 use per patient  No blood thinners per patient  Pt denies issues with constipation  No A fib or A flutter  EMMI video sent to pt's e mail pt declined   

## 2018-03-23 ENCOUNTER — Telehealth: Payer: Self-pay | Admitting: Gastroenterology

## 2018-03-23 MED ORDER — BISACODYL EC 5 MG PO TBEC: 5.0000 mg | DELAYED_RELEASE_TABLET | Freq: Every day | ORAL | 0 refills | Status: DC | PRN

## 2018-03-23 NOTE — Telephone Encounter (Signed)
Pt states [facility] said we need to call in the dulcolax so pt does not have to buy a large bottle- I explained to her she can get the OTC at any drug store , [facility], etc- she wants it called in- Sent in script to [facility] x [facility] only- pt to call with questions

## 2018-04-03 ENCOUNTER — Ambulatory Visit (AMBULATORY_SURGERY_CENTER): Payer: BLUE CROSS/BLUE SHIELD | Admitting: Gastroenterology

## 2018-04-03 ENCOUNTER — Encounter: Payer: Self-pay | Admitting: Gastroenterology

## 2018-04-03 VITALS — BP 128/77 | HR 57 | Temp 96.0°F | Resp 14 | Ht 62.0 in | Wt 211.0 lb

## 2018-04-03 DIAGNOSIS — Z1211 Encounter for screening for malignant neoplasm of colon: Secondary | ICD-10-CM | POA: Diagnosis not present

## 2018-04-03 DIAGNOSIS — D122 Benign neoplasm of ascending colon: Secondary | ICD-10-CM

## 2018-04-03 LAB — HM COLONOSCOPY

## 2018-04-03 MED ORDER — SODIUM CHLORIDE 0.9 % IV SOLN: 500.0000 mL | Freq: Once | INTRAVENOUS | Status: DC

## 2018-04-03 NOTE — Op Note (Addendum)
[place] Patient Name: Katie Richards Procedure Date: 04/03/2018 1:18 PM MRN: [id number] Endoscopist: Milus Banister , MD Age: 62 Referring MD:  Date of Birth: [birth year] Gender: Female Account #: [id number] Procedure:                Colonoscopy Indications:              Screening for colorectal malignant neoplasm Medicines:                Monitored Anesthesia Care Procedure:                Pre-Anesthesia Assessment:                           - Prior to the procedure, a History and Physical                            was performed, and patient medications and                            allergies were reviewed. The patient's tolerance of                            previous anesthesia was also reviewed. The risks                            and benefits of the procedure and the sedation                            options and risks were discussed with the patient.                            All questions were answered, and informed consent                            was obtained. Prior Anticoagulants: The patient has                            taken no previous anticoagulant or antiplatelet                            agents. ASA Grade Assessment: II - A patient with                            mild systemic disease. After reviewing the risks                            and benefits, the patient was deemed in                            satisfactory condition to undergo the procedure.                           After obtaining informed consent, the colonoscope  was passed under direct vision. Throughout the                            procedure, the patient's blood pressure, pulse, and                            oxygen saturations were monitored continuously. The                            Model PCF-[id number]) scope was introduced                            through the anus and advanced to the the cecum,                            identified  by appendiceal orifice and ileocecal                            valve. The colonoscopy was performed without                            difficulty. The patient tolerated the procedure                            well. The quality of the bowel preparation was                            good. The ileocecal valve, appendiceal orifice, and                            rectum were photographed. Scope In: 1:24:17 PM Scope Out: 1:35:53 PM Scope Withdrawal Time: 0 hours 9 minutes 23 seconds  Total Procedure Duration: 0 hours 11 minutes 36 seconds  Findings:                 A 2 mm polyp was found in the ascending colon. The                            polyp was sessile. The polyp was removed with a                            cold snare. Resection and retrieval were complete.                           The exam was otherwise without abnormality on                            direct and retroflexion views. Complications:            No immediate complications. Estimated Blood Loss:     Estimated blood loss: none. Impression:               - One 2 mm polyp in the ascending colon, removed  with a cold snare. Resected and retrieved.                           - The examination was otherwise normal on direct                            and retroflexion views. Recommendation:           - Patient has a contact number available for                            emergencies. The signs and symptoms of potential                            delayed complications were discussed with the                            patient. Return to normal activities tomorrow.                            Written discharge instructions were provided to the                            patient.                           - Resume previous diet.                           - Continue present medications.                           You will receive a letter within 2-3 weeks with the                            pathology  results and my final recommendations.                           If the polyp(s) is proven to be 'pre-cancerous' on                            pathology, you will need repeat colonoscopy in 5                            years. If the polyp(s) is NOT 'precancerous' on                            pathology then you should repeat colon cancer                            screening in 10 years with colonoscopy without need                            for colon cancer screening by any method prior to  then (including stool testing). Milus Banister, MD 04/03/2018 1:40:11 PM This report has been signed electronically.

## 2018-04-03 NOTE — Progress Notes (Signed)
Called to room to assist during endoscopic procedure.  Patient ID and intended procedure confirmed with present staff. Received instructions for my participation in the procedure from the performing physician.  

## 2018-04-03 NOTE — Patient Instructions (Signed)
Handouts: Polyps  YOU HAD AN ENDOSCOPIC PROCEDURE TODAY AT THE [facility]:   Refer to the procedure report that was given to you for any specific questions about what was found during the examination.  If the procedure report does not answer your questions, please call your gastroenterologist to clarify.  If you requested that your care partner not be given the details of your procedure findings, then the procedure report has been included in a sealed envelope for you to review at your convenience later.  YOU SHOULD EXPECT: Some feelings of bloating in the abdomen. Passage of more gas than usual.  Walking can help get rid of the air that was put into your GI tract during the procedure and reduce the bloating. If you had a lower endoscopy (such as a colonoscopy or flexible sigmoidoscopy) you may notice spotting of blood in your stool or on the toilet paper. If you underwent a bowel prep for your procedure, you may not have a normal bowel movement for a few days.  Please Note:  You might notice some irritation and congestion in your nose or some drainage.  This is from the oxygen used during your procedure.  There is no need for concern and it should clear up in a day or so.  SYMPTOMS TO REPORT IMMEDIATELY:   Following lower endoscopy (colonoscopy or flexible sigmoidoscopy):  Excessive amounts of blood in the stool  Significant tenderness or worsening of abdominal pains  Swelling of the abdomen that is new, acute  Fever of 100F or higher  For urgent or emergent issues, a gastroenterologist can be reached at any hour by calling [phone].   DIET:  We do recommend a small meal at first, but then you may proceed to your regular diet.  Drink plenty of fluids but you should avoid alcoholic beverages for 24 hours.  ACTIVITY:  You should plan to take it easy for the rest of today and you should NOT DRIVE or use heavy machinery until tomorrow (because of the sedation medicines used  during the test).    FOLLOW UP: Our staff will call the number listed on your records the next business day following your procedure to check on you and address any questions or concerns that you may have regarding the information given to you following your procedure. If we do not reach you, we will leave a message.  However, if you are feeling well and you are not experiencing any problems, there is no need to return our call.  We will assume that you have returned to your regular daily activities without incident.  If any biopsies were taken you will be contacted by phone or by letter within the next 1-3 weeks.  Please call us at [phone] if you have not heard about the biopsies in 3 weeks.    SIGNATURES/CONFIDENTIALITY: You and/or your care partner have signed paperwork which will be entered into your electronic medical record.  These signatures attest to the fact that that the information above on your After Visit Summary has been reviewed and is understood.  Full responsibility of the confidentiality of this discharge information lies with you and/or your care-partner. 

## 2018-04-03 NOTE — Progress Notes (Signed)
Report to PACU, RN, vss, BBS= Clear.  

## 2018-04-06 ENCOUNTER — Telehealth: Payer: Self-pay | Admitting: *Deleted

## 2018-04-06 NOTE — Telephone Encounter (Signed)
  Follow up Call-  Call back number 04/03/2018  Post procedure Call Back phone  # [phone]  Permission to leave phone message Yes  Some recent data might be hidden     Patient questions:  Do you have a fever, pain , or abdominal swelling? No. Pain Score  0 *  Have you tolerated food without any problems? Yes.    Have you been able to return to your normal activities? Yes.    Do you have any questions about your discharge instructions: Diet   No. Medications  No. Follow up visit  No.  Do you have questions or concerns about your Care? No.  Actions: * If pain score is 4 or above: No action needed, pain <4.

## 2018-04-10 ENCOUNTER — Encounter: Payer: Self-pay | Admitting: Gastroenterology

## 2018-05-07 DIAGNOSIS — Z1231 Encounter for screening mammogram for malignant neoplasm of breast: Secondary | ICD-10-CM | POA: Diagnosis not present

## 2018-05-07 DIAGNOSIS — Z01419 Encounter for gynecological examination (general) (routine) without abnormal findings: Secondary | ICD-10-CM | POA: Diagnosis not present

## 2018-05-07 DIAGNOSIS — Z6838 Body mass index (BMI) 38.0-38.9, adult: Secondary | ICD-10-CM | POA: Diagnosis not present

## 2018-05-28 ENCOUNTER — Encounter: Payer: Self-pay | Admitting: Family Medicine

## 2018-05-28 ENCOUNTER — Ambulatory Visit: Payer: BLUE CROSS/BLUE SHIELD | Admitting: Family Medicine

## 2018-05-28 VITALS — BP 132/74 | HR 60 | Temp 98.4°F | Wt 212.0 lb

## 2018-05-28 DIAGNOSIS — I10 Essential (primary) hypertension: Secondary | ICD-10-CM | POA: Diagnosis not present

## 2018-05-28 DIAGNOSIS — M109 Gout, unspecified: Secondary | ICD-10-CM | POA: Diagnosis not present

## 2018-05-28 NOTE — Progress Notes (Signed)
Subjective:    Patient ID: Katie Richards, female    DOB: 1955-08-07, 63 y.o.   MRN: [id number]   No chief complaint on file.   HPI Patient was seen today for follow-up on chronic conditions and TOC, previously seen by Dr. Sherren Mocha.  Pt endorses feeling well overall.  Had mammogram 12/2018 with OB/GYN provider.  Gout: -Taking allopurinol 100 mg daily -pain in feet and R knee -last gout flair was yrs ago -trying to drink more water  HTN: -taking atenolol 25 mg, lasix 20 mg, Kdur 20 mEq -checking bp at home.  Typically 140/85 -trying to eat better, but eating fast food. -trying to drink more water -was exercising. But had issue with plantar fascitis.  Pt's granddaughter, Ames Dura, seen by this provider.  Past Medical History:  Diagnosis Date  . Allergic rhinitis   . Allergy   . Anemia    History of  . Arthritis    knees  . Asthma    Childhood,   . Cataract    forming   . Headache   . History of migraine headaches   . Hypertension   . SVD (spontaneous vaginal delivery)    x 3, 2 living    Allergies  Allergen Reactions  . Sulfonamide Derivatives Hives    ROS General: Denies fever, chills, night sweats, changes in weight, changes in appetite HEENT: Denies headaches, ear pain, changes in vision, rhinorrhea, sore throat CV: Denies CP, palpitations, SOB, orthopnea Pulm: Denies SOB, cough, wheezing GI: Denies abdominal pain, nausea, vomiting, diarrhea, constipation GU: Denies dysuria, hematuria, frequency, vaginal discharge Msk: Denies muscle cramps, joint pains Neuro: Denies weakness, numbness, tingling Skin: Denies rashes, bruising Psych: Denies depression, anxiety, hallucinations    Objective:    Blood pressure 132/74, pulse 60, temperature 98.4 F (36.9 C), temperature source Oral, weight 212 lb (96.2 kg), SpO2 98 %.  Gen. Pleasant, well-nourished, in no distress, normal affect   HEENT:  AFB/AT, face symmetric, no scleral icterus, PERRLA, nares patent  without drainage Lungs: no accessory muscle use, CTAB, no wheezes or rales Cardiovascular: RRR, no m/r/g, no peripheral edema Neuro:  A&Ox3, CN II-XII intact, normal gait Skin:  Warm, no lesions/ rash  Wt Readings from Last 3 Encounters:  04/03/18 211 lb (95.7 kg)  03/20/18 215 lb (97.5 kg)  08/06/17 211 lb (95.7 kg)    Lab Results  Component Value Date   WBC 3.9 (L) 08/06/2017   HGB 12.6 08/06/2017   HCT 38.4 08/06/2017   PLT 178.0 08/06/2017   GLUCOSE 98 06/13/2017   CHOL 197 08/06/2017   TRIG 101.0 08/06/2017   HDL 72.20 08/06/2017   LDLCALC 104 (H) 08/06/2017   ALT 18 08/06/2017   AST 19 08/06/2017   NA 138 06/13/2017   K 3.9 06/13/2017   CL 109 06/13/2017   CREATININE 0.84 06/13/2017   BUN 16 06/13/2017   CO2 22 06/13/2017   TSH 4.33 08/06/2017    Assessment/Plan:  Essential hypertension -Controlled -Continue atenolol 25 mg, Lasix 20 mg, potassium chloride 40 mEq daily -Discussed lifestyle modifications including decreasing sodium intake, increasing p.o. intake of water, increasing physical activity -Patient encouraged to check BP at home and keep a log to bring her to clinic  Gout, unspecified cause, unspecified chronicity, unspecified site -Continue allopurinol 100 mg daily -Continue p.o. intake of water and avoiding foods known to cause gout flares  F/u prn.  CPE due in April.  Grier Mitts, MD

## 2018-08-12 ENCOUNTER — Encounter: Payer: BLUE CROSS/BLUE SHIELD | Admitting: Family Medicine

## 2018-10-28 ENCOUNTER — Other Ambulatory Visit: Payer: Self-pay

## 2018-10-28 ENCOUNTER — Ambulatory Visit (INDEPENDENT_AMBULATORY_CARE_PROVIDER_SITE_OTHER): Payer: BC Managed Care – PPO | Admitting: Family Medicine

## 2018-10-28 ENCOUNTER — Encounter: Payer: Self-pay | Admitting: Family Medicine

## 2018-10-28 VITALS — BP 120/70 | HR 74 | Temp 98.3°F | Wt 210.0 lb

## 2018-10-28 DIAGNOSIS — I10 Essential (primary) hypertension: Secondary | ICD-10-CM

## 2018-10-28 DIAGNOSIS — E049 Nontoxic goiter, unspecified: Secondary | ICD-10-CM | POA: Diagnosis not present

## 2018-10-28 DIAGNOSIS — Z13220 Encounter for screening for lipoid disorders: Secondary | ICD-10-CM

## 2018-10-28 DIAGNOSIS — Z0000 Encounter for general adult medical examination without abnormal findings: Secondary | ICD-10-CM | POA: Diagnosis not present

## 2018-10-28 DIAGNOSIS — Z8639 Personal history of other endocrine, nutritional and metabolic disease: Secondary | ICD-10-CM | POA: Diagnosis not present

## 2018-10-28 DIAGNOSIS — M109 Gout, unspecified: Secondary | ICD-10-CM | POA: Diagnosis not present

## 2018-10-28 DIAGNOSIS — E669 Obesity, unspecified: Secondary | ICD-10-CM | POA: Diagnosis not present

## 2018-10-28 LAB — CBC WITH DIFFERENTIAL/PLATELET
Basophils Absolute: 0 10*3/uL (ref 0.0–0.1)
Basophils Relative: 0.4 % (ref 0.0–3.0)
Eosinophils Absolute: 0.1 10*3/uL (ref 0.0–0.7)
Eosinophils Relative: 1.8 % (ref 0.0–5.0)
HCT: 41.4 % (ref 36.0–46.0)
Hemoglobin: 13.4 g/dL (ref 12.0–15.0)
Lymphocytes Relative: 31.3 % (ref 12.0–46.0)
Lymphs Abs: 1.1 10*3/uL (ref 0.7–4.0)
MCHC: 32.3 g/dL (ref 30.0–36.0)
MCV: 88.2 fl (ref 78.0–100.0)
Monocytes Absolute: 0.3 10*3/uL (ref 0.1–1.0)
Monocytes Relative: 7.1 % (ref 3.0–12.0)
Neutro Abs: 2.2 10*3/uL (ref 1.4–7.7)
Neutrophils Relative %: 59.4 % (ref 43.0–77.0)
Platelets: 170 10*3/uL (ref 150.0–400.0)
RBC: 4.7 Mil/uL (ref 3.87–5.11)
RDW: 14.6 % (ref 11.5–15.5)
WBC: 3.6 10*3/uL — ABNORMAL LOW (ref 4.0–10.5)

## 2018-10-28 LAB — LIPID PANEL
Cholesterol: 186 mg/dL (ref 0–200)
HDL: 71.1 mg/dL (ref >39.00)
LDL Cholesterol: 99 mg/dL (ref 0–99)
NonHDL: 114.83
Total CHOL/HDL Ratio: 3
Triglycerides: 78 mg/dL (ref 0.0–149.0)
VLDL: 15.6 mg/dL (ref 0.0–40.0)

## 2018-10-28 LAB — T4, FREE: Free T4: 0.72 ng/dL (ref 0.60–1.60)

## 2018-10-28 LAB — TSH: TSH: 2.92 u[IU]/mL (ref 0.35–4.50)

## 2018-10-28 LAB — BASIC METABOLIC PANEL
BUN: 22 mg/dL (ref 6–23)
CO2: 25 mEq/L (ref 19–32)
Calcium: 9.5 mg/dL (ref 8.4–10.5)
Chloride: 105 mEq/L (ref 96–112)
Creatinine, Ser: 0.86 mg/dL (ref 0.40–1.20)
GFR: 80.94 mL/min (ref >60.00)
Glucose, Bld: 87 mg/dL (ref 70–99)
Potassium: 4.2 mEq/L (ref 3.5–5.1)
Sodium: 141 mEq/L (ref 135–145)

## 2018-10-28 LAB — VITAMIN D 25 HYDROXY (VIT D DEFICIENCY, FRACTURES): VITD: 46.07 ng/mL (ref 30.00–100.00)

## 2018-10-28 LAB — HEMOGLOBIN A1C: Hgb A1c MFr Bld: 5.5 % (ref 4.6–6.5)

## 2018-10-28 MED ORDER — ATENOLOL 25 MG PO TABS: 25.0000 mg | ORAL_TABLET | Freq: Every day | ORAL | 4 refills | Status: DC

## 2018-10-28 MED ORDER — POTASSIUM CHLORIDE CRYS ER 20 MEQ PO TBCR: 40.0000 meq | EXTENDED_RELEASE_TABLET | Freq: Every day | ORAL | 4 refills | Status: DC

## 2018-10-28 MED ORDER — FUROSEMIDE 20 MG PO TABS: 20.0000 mg | ORAL_TABLET | Freq: Every morning | ORAL | 4 refills | Status: DC

## 2018-10-28 MED ORDER — ALLOPURINOL 100 MG PO TABS: 100.0000 mg | ORAL_TABLET | Freq: Every day | ORAL | 4 refills | Status: DC

## 2018-10-28 NOTE — Progress Notes (Signed)
Subjective:     Katie Richards is a 63 y.o. female and is here for a comprehensive physical exam. The patient reports no problems.  States needs refills on "everything".  Pt states bp has been controlled at home.  Denies HAs, CP, changes in vision.  On allopurinol for gout.  Denies recent flare.  Pt states she has been busy at work 2/2 COVID-19.  Working at [employer].    Social History   Socioeconomic History  . Marital status: Married    Spouse name: Not on file  . Number of children: 2  . Years of education: Not on file  . Highest education level: Not on file  Occupational History  . Occupation: [employer]  Social Needs  . Financial resource strain: Not on file  . Food insecurity    Worry: Not on file    Inability: Not on file  . Transportation needs    Medical: Not on file    Non-medical: Not on file  Tobacco Use  . Smoking status: Never Smoker  . Smokeless tobacco: Never Used  Substance and Sexual Activity  . Alcohol use: Yes    Alcohol/week: 2.0 standard drinks    Types: 2 Glasses of wine per week  . Drug use: No  . Sexual activity: Not on file  Lifestyle  . Physical activity    Days per week: Not on file    Minutes per session: Not on file  . Stress: Not on file  Relationships  . Social Herbalist on phone: Not on file    Gets together: Not on file    Attends religious service: Not on file    Active member of club or organization: Not on file    Attends meetings of clubs or organizations: Not on file    Relationship status: Not on file  . Intimate partner violence    Fear of current or ex partner: Not on file    Emotionally abused: Not on file    Physically abused: Not on file    Forced sexual activity: Not on file  Other Topics Concern  . Not on file  Social History Narrative  . Not on file   Health Maintenance  Topic Date Due  . Hepatitis C Screening  1955-04-21  . HIV Screening  12/14/1971  . PAP SMEAR-Modifier  01/05/2018   . INFLUENZA VACCINE  11/14/2018  . MAMMOGRAM  04/18/2019  . TETANUS/TDAP  04/27/2022  . COLONOSCOPY  04/04/2023    The following portions of the patient's history were reviewed and updated as appropriate: allergies, current medications, past family history, past medical history, past social history, past surgical history and problem list.  Review of Systems Pertinent items noted in HPI and remainder of comprehensive ROS otherwise negative.   Objective:    BP 120/70 (BP Location: Left Arm, Patient Position: Sitting, Cuff Size: Large)   Pulse 74   Temp 98.3 F (36.8 C) (Oral)   Wt 210 lb (95.3 kg)   SpO2 98%   BMI 38.41 kg/m  General appearance: alert, cooperative and no distress Head: Normocephalic, without obvious abnormality, atraumatic Eyes: conjunctivae/corneas clear. PERRL, EOM's intact. Fundi benign. Ears: normal TM's and external ear canals both ears Nose: Nares normal. Septum midline. Mucosa normal. No drainage or sinus tenderness. Throat: lips, mucosa, and tongue normal; teeth and gums normal Neck: no adenopathy, no carotid bruit, no JVD, supple, symmetrical, trachea midline, R lobe of thyroid >L lobe, no tenderness/mass/nodules  Lungs: clear to auscultation bilaterally Heart: regular rate and rhythm, S1, S2 normal, no murmur, click, rub or gallop Abdomen: soft, non-tender; bowel sounds normal; no masses,  no organomegaly Extremities: extremities normal, atraumatic, no cyanosis or edema Pulses: 2+ and symmetric Skin: Skin color, texture, turgor normal. No rashes or lesions Lymph nodes: Cervical, supraclavicular, and axillary nodes normal. Neurologic: Alert and oriented X 3, normal strength and tone. Normal symmetric reflexes. Normal coordination and gait    Assessment:    Healthy female exam with arthritis.     Plan:     Anticipatory guidance given including wearing seatbelts, smoke detectors in the home, increasing physical activity, increasing p.o. intake of  water and vegetables. -will obtain labs -mammogram done this yr. -immunizations up to date See After Visit Summary for Counseling Recommendations    Essential hypertension -Continue atenolol 25 mg, Lasix 20 mg, K. Dur 20 mEq -Continue ice modifications  Goiter -Plan: TSH, free T4 -given handout -discussed thyroid u/s.  Pt wishes to wait until lab results return.  Gout -Stable -Continue allopurinol 100 mg daily -Avoid dairy rich foods  History of vitamin D deficiency -Plan: Vitamin D 25 hydroxy  Screen for cholesterol -plan:  Lipid panel  Obesity -BMI 38.41 -Discussed lifestyle measures  F/u prn  Grier Mitts, MD

## 2018-10-28 NOTE — Patient Instructions (Signed)
Preventive Care 40-62 Years Old, Female °Preventive care refers to visits with your health care provider and lifestyle choices that can promote health and wellness. This includes: °· A yearly physical exam. This may also be called an annual well check. °· Regular dental visits and eye exams. °· Immunizations. °· Screening for certain conditions. °· Healthy lifestyle choices, such as eating a healthy diet, getting regular exercise, not using drugs or products that contain nicotine and tobacco, and limiting alcohol use. °What can I expect for my preventive care visit? °Physical exam °Your health care provider will check your: °· Height and weight. This may be used to calculate body mass index (BMI), which tells if you are at a healthy weight. °· Heart rate and blood pressure. °· Skin for abnormal spots. °Counseling °Your health care provider may ask you questions about your: °· Alcohol, tobacco, and drug use. °· Emotional well-being. °· Home and relationship well-being. °· Sexual activity. °· Eating habits. °· Work and work environment. °· Method of birth control. °· Menstrual cycle. °· Pregnancy history. °What immunizations do I need? ° °Influenza (flu) vaccine °· This is recommended every year. °Tetanus, diphtheria, and pertussis (Tdap) vaccine °· You may need a Td booster every 10 years. °Varicella (chickenpox) vaccine °· You may need this if you have not been vaccinated. °Zoster (shingles) vaccine °· You may need this after age 62. °Measles, mumps, and rubella (MMR) vaccine °· You may need at least one dose of MMR if you were born in 1957 or later. You may also need a second dose. °Pneumococcal conjugate (PCV13) vaccine °· You may need this if you have certain conditions and were not previously vaccinated. °Pneumococcal polysaccharide (PPSV23) vaccine °· You may need one or two doses if you smoke cigarettes or if you have certain conditions. °Meningococcal conjugate (MenACWY) vaccine °· You may need this if you  have certain conditions. °Hepatitis A vaccine °· You may need this if you have certain conditions or if you travel or work in places where you may be exposed to hepatitis A. °Hepatitis B vaccine °· You may need this if you have certain conditions or if you travel or work in places where you may be exposed to hepatitis B. °Haemophilus influenzae type b (Hib) vaccine °· You may need this if you have certain conditions. °Human papillomavirus (HPV) vaccine °· If recommended by your health care provider, you may need three doses over 6 months. °You may receive vaccines as individual doses or as more than one vaccine together in one shot (combination vaccines). Talk with your health care provider about the risks and benefits of combination vaccines. °What tests do I need? °Blood tests °· Lipid and cholesterol levels. These may be checked every 5 years, or more frequently if you are over 62 years old. °· Hepatitis C test. °· Hepatitis B test. °Screening °· Lung cancer screening. You may have this screening every year starting at age 62 if you have a 30-pack-year history of smoking and currently smoke or have quit within the past 15 years. °· Colorectal cancer screening. All adults should have this screening starting at age 62 and continuing until age 62. Your health care provider may recommend screening at age 45 if you are at increased risk. You will have tests every 1-10 years, depending on your results and the type of screening test. °· Diabetes screening. This is done by checking your blood sugar (glucose) after you have not eaten for a while (fasting). You may have this   done every 1-3 years.  Mammogram. This may be done every 1-2 years. Talk with your health care provider about when you should start having regular mammograms. This may depend on whether you have a family history of breast cancer.  BRCA-related cancer screening. This may be done if you have a family history of breast, ovarian, tubal, or peritoneal  cancers.  Pelvic exam and Pap test. This may be done every 3 years starting at age 62. Starting at age 72, this may be done every 5 years if you have a Pap test in combination with an HPV test. Other tests  Sexually transmitted disease (STD) testing.  Bone density scan. This is done to screen for osteoporosis. You may have this scan if you are at high risk for osteoporosis. Follow these instructions at home: Eating and drinking  Eat a diet that includes fresh fruits and vegetables, whole grains, lean protein, and low-fat dairy.  Take vitamin and mineral supplements as recommended by your health care provider.  Do not drink alcohol if: ? Your health care provider tells you not to drink. ? You are pregnant, may be pregnant, or are planning to become pregnant.  If you drink alcohol: ? Limit how much you have to 0-1 drink a day. ? Be aware of how much alcohol is in your drink. In the [place] one drink equals one 12 oz bottle of beer (355 mL), one 5 oz glass of wine (148 mL), or one 1 oz glass of hard liquor (44 mL). Lifestyle  Take daily care of your teeth and gums.  Stay active. Exercise for at least 30 minutes on 5 or more days each week.  Do not use any products that contain nicotine or tobacco, such as cigarettes, e-cigarettes, and chewing tobacco. If you need help quitting, ask your health care provider.  If you are sexually active, practice safe sex. Use a condom or other form of birth control (contraception) in order to prevent pregnancy and STIs (sexually transmitted infections).  If told by your health care provider, take low-dose aspirin daily starting at age 62. What's next?  Visit your health care provider once a year for a well check visit.  Ask your health care provider how often you should have your eyes and teeth checked.  Stay up to date on all vaccines. This information is not intended to replace advice given to you by your health care provider. Make sure you  discuss any questions you have with your health care provider. Document Released: 04/28/2015 Document Revised: 12/11/2017 Document Reviewed: 12/11/2017 Elsevier Patient Education  [place] A low-purine eating plan involves making food choices to limit your intake of purine. Purine is a kind of uric acid. Too much uric acid in your blood can cause certain conditions, such as gout and kidney stones. Eating a low-purine diet can help control these conditions. What are tips for following this plan? Reading food labels   Avoid foods with saturated or Trans fat.  Check the ingredient list of grains-based foods, such as bread and cereal, to make sure that they contain whole grains.  Check the ingredient list of sauces or soups to make sure they do not contain meat or fish.  When choosing soft drinks, check the ingredient list to make sure they do not contain high-fructose corn syrup. Shopping  Buy plenty of fresh fruits and vegetables.  Avoid buying canned or fresh fish.  Buy dairy products labeled as low-fat or nonfat.  Avoid buying premade or processed foods. These foods are often high in fat, salt (sodium), and added sugar. Cooking  Use olive oil instead of butter when cooking. Oils like olive oil, canola oil, and sunflower oil contain healthy fats. Meal planning  Learn which foods do or do not affect you. If you find out that a food tends to cause your gout symptoms to flare up, avoid eating that food. You can enjoy foods that do not cause problems. If you have any questions about a food item, talk with your dietitian or health care provider.  Limit foods high in fat, especially saturated fat. Fat makes it harder for your body to get rid of uric acid.  Choose foods that are lower in fat and are lean sources of protein. General guidelines  Limit alcohol intake to no more than 1 drink a day for nonpregnant women and 2 drinks a day for men. One drink  equals 12 oz of beer, 5 oz of wine, or 1 oz of hard liquor. Alcohol can affect the way your body gets rid of uric acid.  Drink plenty of water to keep your urine clear or pale yellow. Fluids can help remove uric acid from your body.  If directed by your health care provider, take a vitamin C supplement.  Work with your health care provider and dietitian to develop a plan to achieve or maintain a healthy weight. Losing weight can help reduce uric acid in your blood. What foods are recommended? The items listed may not be a complete list. Talk with your dietitian about what dietary choices are best for you. Foods low in purines Foods low in purines do not need to be limited. These include:  All fruits.  All low-purine vegetables, pickles, and olives.  Breads, pasta, rice, cornbread, and popcorn. Cake and other baked goods.  All dairy foods.  Eggs, nuts, and nut butters.  Spices and condiments, such as salt, herbs, and vinegar.  Plant oils, butter, and margarine.  Water, sugar-free soft drinks, tea, coffee, and cocoa.  Vegetable-based soups, broths, sauces, and gravies. Foods moderate in purines Foods moderate in purines should be limited to the amounts listed.   cup of asparagus, cauliflower, spinach, mushrooms, or green peas, each day.  2/3 cup uncooked oatmeal, each day.   cup dry wheat bran or wheat germ, each day.  2-3 ounces of meat or poultry, each day.  4-6 ounces of shellfish, such as crab, lobster, oysters, or shrimp, each day.  1 cup cooked beans, peas, or lentils, each day.  Soup, broths, or bouillon made from meat or fish. Limit these foods as much as possible. What foods are not recommended? The items listed may not be a complete list. Talk with your dietitian about what dietary choices are best for you. Limit your intake of foods high in purines, including:  Beer and other alcohol.  Meat-based gravy or sauce.  Canned or fresh fish, such  as: ? Anchovies, sardines, herring, and tuna. ? Mussels and scallops. ? Codfish, trout, and haddock.  [place] Salines.  Organ meats, such as: ? Liver or kidney. ? Tripe. ? Sweetbreads (thymus gland or pancreas).  [employer].  Yeast or yeast extract supplements.  Drinks sweetened with high-fructose corn syrup. Summary  Eating a low-purine diet can help control conditions caused by too much uric acid in the body, such as gout or kidney stones.  Choose low-purine foods, limit alcohol, and limit foods high in fat.  You will learn  over time which foods do or do not affect you. If you find out that a food tends to cause your gout symptoms to flare up, avoid eating that food. This information is not intended to replace advice given to you by your health care provider. Make sure you discuss any questions you have with your health care provider. Document Released: 07/27/2010 Document Revised: 03/14/2017 Document Reviewed: 05/15/2016 Elsevier Patient Education  [place]  A goiter is an enlarged thyroid gland. The thyroid is located in the lower front of the neck. It makes hormones that affect many body parts and systems, including the system that affects how quickly the body burns fuel for energy (metabolism). Most goiters are painless and are not a cause for concern. Some goiters can affect the way your thyroid makes thyroid hormones. Goiters and conditions that cause goiters can be treated, if necessary. What are the causes? Common causes of this condition include:  Lack (deficiency) of a mineral called iodine. The thyroid gland uses iodine to make thyroid hormones.  Diseases that attack healthy cells in the body (autoimmune diseases) and affect thyroid function, such as Graves' disease or Hashimoto's disease. These diseases may cause the body to produce too much thyroid hormone (hyperthyroidism) or too little of the hormone (hypothyroidism).  Conditions that cause  inflammation of the thyroid (thyroiditis).  One or more small growths on the thyroid (nodular goiter). Other causes include:  Medical problems caused by abnormal genes that are passed from parent to child (genetic defects).  Thyroid injury or infection.  Tumors that may or may not be cancerous.  Pregnancy.  Certain medicines.  Exposure to radiation. In some cases, the cause may not be known. What increases the risk? This condition is more likely to develop in:  People who do not get enough iodine in their diet.  People who have a family history of goiter.  Women.  People who are older than age 21.  People who smoke tobacco.  People who have had exposure to radiation. What are the signs or symptoms? The main symptom of this condition is swelling in the lower, front part of the neck. This swelling can range from a very small bump to a large lump. Other symptoms may include:  A tight feeling in the throat.  A hoarse voice.  Coughing.  Wheezing.  Difficulty swallowing or breathing.  Bulging veins in the neck.  Dizziness. When a goiter is the result of an overactive thyroid (hyperthyroidism), symptoms may also include:  Nervousness or restlessness.  Inability to tolerate heat.  Unexplained weight loss.  Diarrhea.  Change in the texture of hair or skin.  Changes in heartbeat, such as skipped beats, extra beats, or a rapid heart rate.  Loss of menstruation.  Shaky hands.  Increased appetite.  Sleep problems. When a goiter is the result of an underactive thyroid (hypothyroidism), symptoms may also include:  Feeling like you have no energy (lethargy).  Inability to tolerate cold.  Weight gain that is not explained by a change in diet or exercise habits.  Dry skin.  Coarse hair.  Irregular menstrual periods.  Constipation.  Sadness or depression.  Fatigue. In some cases, there may not be any symptoms and the thyroid hormone levels may be  normal. How is this diagnosed? This condition may be diagnosed based on your symptoms, your medical history, and a physical exam. You may have tests, such as:  Blood tests to check thyroid function.  Imaging tests, such as: ?  Ultrasound. ? CT scan. ? MRI. ? Thyroid scan.  Removal of a tissue sample (biopsy) of the goiter or any nodules. The sample will be tested to check for cancer. How is this treated? Treatment for this condition depends on the cause and your symptoms. Treatment may include:  Medicines to regulate thyroid hormone levels.  Anti-inflammatory medicines or steroid medicines, if the goiter is caused by inflammation.  Iodine supplements or changes to your diet, if the goiter is caused by iodine deficiency.  Radioactive iodine treatment.  Surgery to remove your thyroid. In some cases, you may only need regular check-ups with your health care provider to monitor your condition, and you may not need treatment. Follow these instructions at home:  Follow instructions from your health care provider about any changes to your diet.  Take over-the-counter and prescription medicines only as told by your health care provider. These include supplements.  Do not use any products that contain nicotine or tobacco, such as cigarettes and e-cigarettes. If you need help quitting, ask your health care provider.  Keep all follow-up visits as told by your health care provider. This is important. Contact a health care provider if:  Your symptoms do not get better with treatment.  You have nausea, vomiting, or diarrhea. Get help right away if:  You have sudden, unexplained confusion or other mental changes.  You have a fever.  You have chest pain.  You have trouble breathing or swallowing.  You suddenly become very weak.  You experience extreme restlessness.  You feel your heart racing. Summary  A goiter is an enlarged thyroid gland.  The thyroid gland is located in  the lower front of the neck. It makes hormones that affect many body parts and systems, including the system that affects how quickly the body burns fuel for energy (metabolism).  The main symptom of this condition is swelling in the lower, front part of the neck. This swelling can range from a very small bump to a large lump.  Treatment for this condition depends on the cause and your symptoms. You may need medicines, supplements, or regular monitoring of your condition. This information is not intended to replace advice given to you by your health care provider. Make sure you discuss any questions you have with your health care provider. Document Released: 09/19/2009 Document Revised: 03/14/2017 Document Reviewed: 12/26/2016 Elsevier Patient Education  2020 [place] is a term that is commonly used to refer to joint pain or joint disease. There are more than 100 types of arthritis. What are the causes? The most common cause of this condition is wear and tear of a joint. Other causes include:  Gout.  Inflammation of a joint.  An infection of a joint.  Sprains and other injuries near the joint.  A reaction to medicines or drugs, or an allergic reaction. In some cases, the cause may not be known. What are the signs or symptoms? The main symptom of this condition is pain in the joint during movement. Other symptoms include:  Redness, swelling, or stiffness at a joint.  Warmth coming from the joint.  Fever.  Overall feeling of illness. How is this diagnosed? This condition may be diagnosed with a physical exam and tests, including:  Blood tests.  Urine tests.  Imaging tests, such as X-rays, an MRI, or a CT scan. Sometimes, fluid is removed from a joint for testing. How is this treated? This condition may be treated with:  Treatment of the  cause, if it is known.  Rest.  Raising (elevating) the joint.  Applying cold or hot packs to the  joint.  Medicines to improve symptoms and reduce inflammation.  Injections of a steroid such as cortisone into the joint to help reduce pain and inflammation. Depending on the cause of your arthritis, you may need to make lifestyle changes to reduce stress on your joint. Changes may include:  Exercising more.  Losing weight. Follow these instructions at home: Medicines  Take over-the-counter and prescription medicines only as told by your health care provider.  Do not take aspirin to relieve pain if your health care provider thinks that gout may be causing your pain. Activity  Rest your joint if told by your health care provider. Rest is important when your disease is active and your joint feels painful, swollen, or stiff.  Avoid activities that make the pain worse. It is important to balance activity with rest.  Exercise your joint regularly with range-of-motion exercises as told by your health care provider. Try doing low-impact exercise, such as: ? Swimming. ? Water aerobics. ? Biking. ? Walking. Managing pain, stiffness, and swelling      If directed, put ice on the joint. ? Put ice in a plastic bag. ? Place a towel between your skin and the bag. ? Leave the ice on for 20 minutes, 2-3 times per day.  If your joint is swollen, raise (elevate) it above the level of your heart if directed by your health care provider.  If your joint feels stiff in the morning, try taking a warm shower.  If directed, apply heat to the affected area as often as told by your health care provider. Use the heat source that your health care provider recommends, such as a moist heat pack or a heating pad. If you have diabetes, do not apply heat without permission from your health care provider. To apply heat: ? Place a towel between your skin and the heat source. ? Leave the heat on for 20-30 minutes. ? Remove the heat if your skin turns bright red. This is especially important if you are unable  to feel pain, heat, or cold. You may have a greater risk of getting burned. General instructions  Do not use any products that contain nicotine or tobacco, such as cigarettes, e-cigarettes, and chewing tobacco. If you need help quitting, ask your health care provider.  Keep all follow-up visits as told by your health care provider. This is important. Contact a health care provider if:  The pain gets worse.  You have a fever. Get help right away if:  You develop severe joint pain, swelling, or redness.  Many joints become painful and swollen.  You develop severe back pain.  You develop severe weakness in your leg.  You cannot control your bladder or bowels. Summary  Arthritis is a term that is commonly used to refer to joint pain or joint disease. There are more than 100 types of arthritis.  The most common cause of this condition is wear and tear of a joint. Other causes include gout, inflammation or infection of the joint, sprains, or allergies.  Symptoms of this condition include redness, swelling, or stiffness of the joint. Other symptoms include warmth, fever, or feeling ill.  This condition is treated with rest, elevation, medicines, and applying cold or hot packs.  Follow your health care provider's instructions about medicines, activity, exercises, and other home care treatments. This information is not intended to replace advice  given to you by your health care provider. Make sure you discuss any questions you have with your health care provider. Document Released: 05/09/2004 Document Revised: 03/09/2018 Document Reviewed: 03/09/2018 Elsevier Patient Education  2020 [employer].

## 2019-05-28 DIAGNOSIS — Z01419 Encounter for gynecological examination (general) (routine) without abnormal findings: Secondary | ICD-10-CM | POA: Diagnosis not present

## 2019-05-28 DIAGNOSIS — Z1231 Encounter for screening mammogram for malignant neoplasm of breast: Secondary | ICD-10-CM | POA: Diagnosis not present

## 2019-05-28 DIAGNOSIS — Z6838 Body mass index (BMI) 38.0-38.9, adult: Secondary | ICD-10-CM | POA: Diagnosis not present

## 2019-05-28 LAB — HM MAMMOGRAPHY

## 2019-08-24 DIAGNOSIS — M2241 Chondromalacia patellae, right knee: Secondary | ICD-10-CM | POA: Diagnosis not present

## 2019-11-12 ENCOUNTER — Other Ambulatory Visit: Payer: Self-pay

## 2019-11-12 ENCOUNTER — Ambulatory Visit (INDEPENDENT_AMBULATORY_CARE_PROVIDER_SITE_OTHER): Payer: BC Managed Care – PPO | Admitting: Family Medicine

## 2019-11-12 ENCOUNTER — Encounter: Payer: Self-pay | Admitting: Family Medicine

## 2019-11-12 VITALS — BP 118/70 | HR 60 | Temp 98.1°F | Ht 62.0 in | Wt 212.0 lb

## 2019-11-12 DIAGNOSIS — I10 Essential (primary) hypertension: Secondary | ICD-10-CM

## 2019-11-12 DIAGNOSIS — Z0000 Encounter for general adult medical examination without abnormal findings: Secondary | ICD-10-CM

## 2019-11-12 DIAGNOSIS — R252 Cramp and spasm: Secondary | ICD-10-CM

## 2019-11-12 DIAGNOSIS — R600 Localized edema: Secondary | ICD-10-CM | POA: Diagnosis not present

## 2019-11-12 DIAGNOSIS — Z13220 Encounter for screening for lipoid disorders: Secondary | ICD-10-CM | POA: Diagnosis not present

## 2019-11-12 DIAGNOSIS — M109 Gout, unspecified: Secondary | ICD-10-CM

## 2019-11-12 MED ORDER — FUROSEMIDE 20 MG PO TABS: 20.0000 mg | ORAL_TABLET | Freq: Every morning | ORAL | 4 refills | Status: DC

## 2019-11-12 MED ORDER — ALLOPURINOL 100 MG PO TABS: 100.0000 mg | ORAL_TABLET | Freq: Every day | ORAL | 4 refills | Status: DC

## 2019-11-12 MED ORDER — MEDICAL COMPRESSION SOCKS MISC: 1.0000 | Freq: Every day | 0 refills | Status: AC

## 2019-11-12 MED ORDER — ATENOLOL 25 MG PO TABS: 25.0000 mg | ORAL_TABLET | Freq: Every day | ORAL | 4 refills | Status: DC

## 2019-11-12 MED ORDER — POTASSIUM CHLORIDE CRYS ER 20 MEQ PO TBCR: 40.0000 meq | EXTENDED_RELEASE_TABLET | Freq: Every day | ORAL | 4 refills | Status: DC

## 2019-11-12 NOTE — Patient Instructions (Signed)
Preventive Care 40-64 Years Old, Female Preventive care refers to visits with your health care provider and lifestyle choices that can promote health and wellness. This includes:  A yearly physical exam. This may also be called an annual well check.  Regular dental visits and eye exams.  Immunizations.  Screening for certain conditions.  Healthy lifestyle choices, such as eating a healthy diet, getting regular exercise, not using drugs or products that contain nicotine and tobacco, and limiting alcohol use. What can I expect for my preventive care visit? Physical exam Your health care provider will check your:  Height and weight. This may be used to calculate body mass index (BMI), which tells if you are at a healthy weight.  Heart rate and blood pressure.  Skin for abnormal spots. Counseling Your health care provider may ask you questions about your:  Alcohol, tobacco, and drug use.  Emotional well-being.  Home and relationship well-being.  Sexual activity.  Eating habits.  Work and work environment.  Method of birth control.  Menstrual cycle.  Pregnancy history. What immunizations do I need?  Influenza (flu) vaccine  This is recommended every year. Tetanus, diphtheria, and pertussis (Tdap) vaccine  You may need a Td booster every 10 years. Varicella (chickenpox) vaccine  You may need this if you have not been vaccinated. Zoster (shingles) vaccine  You may need this after age 60. Measles, mumps, and rubella (MMR) vaccine  You may need at least one dose of MMR if you were born in 1957 or later. You may also need a second dose. Pneumococcal conjugate (PCV13) vaccine  You may need this if you have certain conditions and were not previously vaccinated. Pneumococcal polysaccharide (PPSV23) vaccine  You may need one or two doses if you smoke cigarettes or if you have certain conditions. Meningococcal conjugate (MenACWY) vaccine  You may need this if you  have certain conditions. Hepatitis A vaccine  You may need this if you have certain conditions or if you travel or work in places where you may be exposed to hepatitis A. Hepatitis B vaccine  You may need this if you have certain conditions or if you travel or work in places where you may be exposed to hepatitis B. Haemophilus influenzae type b (Hib) vaccine  You may need this if you have certain conditions. Human papillomavirus (HPV) vaccine  If recommended by your health care provider, you may need three doses over 6 months. You may receive vaccines as individual doses or as more than one vaccine together in one shot (combination vaccines). Talk with your health care provider about the risks and benefits of combination vaccines. What tests do I need? Blood tests  Lipid and cholesterol levels. These may be checked every 5 years, or more frequently if you are over 50 years old.  Hepatitis C test.  Hepatitis B test. Screening  Lung cancer screening. You may have this screening every year starting at age 55 if you have a 30-pack-year history of smoking and currently smoke or have quit within the past 15 years.  Colorectal cancer screening. All adults should have this screening starting at age 50 and continuing until age 75. Your health care provider may recommend screening at age 45 if you are at increased risk. You will have tests every 1-10 years, depending on your results and the type of screening test.  Diabetes screening. This is done by checking your blood sugar (glucose) after you have not eaten for a while (fasting). You may have this   done every 1-3 years.  Mammogram. This may be done every 1-2 years. Talk with your health care provider about when you should start having regular mammograms. This may depend on whether you have a family history of breast cancer.  BRCA-related cancer screening. This may be done if you have a family history of breast, ovarian, tubal, or peritoneal  cancers.  Pelvic exam and Pap test. This may be done every 3 years starting at age 16. Starting at age 66, this may be done every 5 years if you have a Pap test in combination with an HPV test. Other tests  Sexually transmitted disease (STD) testing.  Bone density scan. This is done to screen for osteoporosis. You may have this scan if you are at high risk for osteoporosis. Follow these instructions at home: Eating and drinking  Eat a diet that includes fresh fruits and vegetables, whole grains, lean protein, and low-fat dairy.  Take vitamin and mineral supplements as recommended by your health care provider.  Do not drink alcohol if: ? Your health care provider tells you not to drink. ? You are pregnant, may be pregnant, or are planning to become pregnant.  If you drink alcohol: ? Limit how much you have to 0-1 drink a day. ? Be aware of how much alcohol is in your drink. In the [place] one drink equals one 12 oz bottle of beer (355 mL), one 5 oz glass of wine (148 mL), or one 1 oz glass of hard liquor (44 mL). Lifestyle  Take daily care of your teeth and gums.  Stay active. Exercise for at least 30 minutes on 5 or more days each week.  Do not use any products that contain nicotine or tobacco, such as cigarettes, e-cigarettes, and chewing tobacco. If you need help quitting, ask your health care provider.  If you are sexually active, practice safe sex. Use a condom or other form of birth control (contraception) in order to prevent pregnancy and STIs (sexually transmitted infections).  If told by your health care provider, take low-dose aspirin daily starting at age 66. What's next?  Visit your health care provider once a year for a well check visit.  Ask your health care provider how often you should have your eyes and teeth checked.  Stay up to date on all vaccines. This information is not intended to replace advice given to you by your health care provider. Make sure you  discuss any questions you have with your health care provider. Document Revised: 12/11/2017 Document Reviewed: 12/11/2017 Elsevier Patient Education  [place].  Peripheral Edema  Peripheral edema is swelling that is caused by a buildup of fluid. Peripheral edema most often affects the lower legs, ankles, and feet. It can also develop in the arms, hands, and face. The area of the body that has peripheral edema will look swollen. It may also feel heavy or warm. Your clothes may start to feel tight. Pressing on the area may make a temporary dent in your skin. You may not be able to move your swollen arm or leg as much as usual. There are many causes of peripheral edema. It can happen because of a complication of other conditions such as congestive heart failure, kidney disease, or a problem with your blood circulation. It also can be a side effect of certain medicines or because of an infection. It often happens to women during pregnancy. Sometimes, the cause is not known. Follow these instructions at home: Managing pain, stiffness,  and swelling   Raise (elevate) your legs while you are sitting or lying down.  Move around often to prevent stiffness and to lessen swelling.  Do not sit or stand for long periods of time.  Wear support stockings as told by your health care provider. Medicines  Take over-the-counter and prescription medicines only as told by your health care provider.  Your health care provider may prescribe medicine to help your body get rid of excess water (diuretic). General instructions  Pay attention to any changes in your symptoms.  Follow instructions from your health care provider about limiting salt (sodium) in your diet. Sometimes, eating less salt may reduce swelling.  Moisturize skin daily to help prevent skin from cracking and draining.  Keep all follow-up visits as told by your health care provider. This is important. Contact a health care provider if  you have:  A fever.  Edema that starts suddenly or is getting worse, especially if you are pregnant or have a medical condition.  Swelling in only one leg.  Increased swelling, redness, or pain in one or both of your legs.  Drainage or sores at the area where you have edema. Get help right away if you:  Develop shortness of breath, especially when you are lying down.  Have pain in your chest or abdomen.  Feel weak.  Feel faint. Summary  Peripheral edema is swelling that is caused by a buildup of fluid. Peripheral edema most often affects the lower legs, ankles, and feet.  Move around often to prevent stiffness and to lessen swelling. Do not sit or stand for long periods of time.  Pay attention to any changes in your symptoms.  Contact a health care provider if you have edema that starts suddenly or is getting worse, especially if you are pregnant or have a medical condition.  Get help right away if you develop shortness of breath, especially when lying down. This information is not intended to replace advice given to you by your health care provider. Make sure you discuss any questions you have with your health care provider. Document Revised: 12/24/2017 Document Reviewed: 12/24/2017 Elsevier Patient Education  [place].

## 2019-11-12 NOTE — Progress Notes (Signed)
Subjective:     Katie Richards is a 64 y.o. female and is here for a comprehensive physical exam. The patient reports problems - LE edema, cramp in R calf.  Patient notes swelling in lower extremities throughout the day.  Resolves in a.m. but returns the next day.  Pt has difficulty wearing compression socks as her calfs are larger making the socks tight.  Pt denies increased sodium intake.  Notes family history of similar edema.  Denies joint pain.  Pt working to decrease weight.  Pt notes occasional R calf cramp described as a sharp stabbing pain.  Taking Lasix 20 mg and K. Dur 40 mEq.  Pt completed 2 doses of the Moderna COVID-19 vaccine in March and April. Status post hysterectomy.  Patient retired in February after working 33 yrs.  States she has been busy around her house and is enjoying retirement.  Social History   Socioeconomic History  . Marital status: Married    Spouse name: Not on file  . Number of children: 2  . Years of education: Not on file  . Highest education level: Not on file  Occupational History  . Occupation: [employer]  Tobacco Use  . Smoking status: Never Smoker  . Smokeless tobacco: Never Used  Vaping Use  . Vaping Use: Never used  Substance and Sexual Activity  . Alcohol use: Yes    Alcohol/week: 2.0 standard drinks    Types: 2 Glasses of wine per week  . Drug use: No  . Sexual activity: Not on file  Other Topics Concern  . Not on file  Social History Narrative  . Not on file   Social Determinants of Health   Financial Resource Strain:   . Difficulty of Paying Living Expenses:   Food Insecurity:   . Worried About Charity fundraiser in the Last Year:   . Arboriculturist in the Last Year:   Transportation Needs:   . Film/video editor (Medical):   Marland Kitchen Lack of Transportation (Non-Medical):   Physical Activity:   . Days of Exercise per Week:   . Minutes of Exercise per Session:   Stress:   . Feeling of Stress :   Social  Connections:   . Frequency of Communication with Friends and Family:   . Frequency of Social Gatherings with Friends and Family:   . Attends Religious Services:   . Active Member of Clubs or Organizations:   . Attends Archivist Meetings:   Marland Kitchen Marital Status:   Intimate Partner Violence:   . Fear of Current or Ex-Partner:   . Emotionally Abused:   Marland Kitchen Physically Abused:   . Sexually Abused:    Health Maintenance  Topic Date Due  . Hepatitis C Screening  Never done  . COVID-19 Vaccine (1) Never done  . HIV Screening  Never done  . PAP SMEAR-Modifier  01/05/2018  . MAMMOGRAM  04/18/2019  . INFLUENZA VACCINE  11/14/2019  . TETANUS/TDAP  04/27/2022  . COLONOSCOPY  04/04/2023    The following portions of the patient's history were reviewed and updated as appropriate: allergies, current medications, past family history, past medical history, past social history, past surgical history and problem list.  Review of Systems Pertinent items noted in HPI and remainder of comprehensive ROS otherwise negative.   Objective:    BP 118/70 (BP Location: Left Arm, Patient Position: Sitting, Cuff Size: Large)   Pulse 60   Temp 98.1 F (36.7 C) (Oral)  Ht _0  (1.575 m)   Wt (!) 212 lb (96.2 kg)   SpO2 97%   BMI 38.78 kg/m  General appearance: alert, cooperative and no distress Head: Normocephalic, without obvious abnormality, atraumatic Eyes: conjunctivae/corneas clear. PERRL, EOM's intact. Fundi benign. Ears: normal TM's and external ear canals both ears Nose: Nares normal. Septum midline. Mucosa normal. No drainage or sinus tenderness. Throat: lips, mucosa, and tongue normal; teeth and gums normal Neck: no adenopathy, no carotid bruit, no JVD, supple, symmetrical, trachea midline and thyroid not enlarged, symmetric, no tenderness/mass/nodules Lungs: clear to auscultation bilaterally Heart: regular rate and rhythm, S1, S2 normal, no murmur, click, rub or gallop Abdomen: soft,  non-tender; bowel sounds normal; no masses,  no organomegaly Extremities: trace edema b/l LE at shin.  Extremities normal, atraumatic, no cyanosis.   Pulses: 2+ and symmetric Skin: Skin color, texture, turgor normal. No rashes or lesions Lymph nodes: Cervical, supraclavicular, and axillary nodes normal. Neurologic: Alert and oriented X 3, normal strength and tone. Normal symmetric reflexes. Normal coordination and gait    Assessment:    Healthy female exam with LE edema and occasional cramp in R calf.     Plan:     Anticipatory guidance given including wearing seatbelts, smoke detectors in the home, increasing physical activity, increasing p.o. intake of water and vegetables. -We will obtain labs -Mammogram done 12/2018.  Due 12/2019 -Colonoscopy completed 04/03/2018. -Pap declined 2/2 history of hysterectomy.  Consider checking vaginal cuff. -Given handout -Next CPE in 1 year See After Visit Summary for Counseling Recommendations    Bilateral lower extremity edema  -Likely 2/2 dependent edema. -Discussed supportive care including compression socks, decreasing sodium intake, elevation -Taking Lasix 20 mg daily -Given handout -We will obtain labs - Plan: CMP with eGFR([facility]), TSH, Brain Natriuretic Peptide, Elastic Bandages & Supports (MEDICAL COMPRESSION SOCKS) MISC  Screening for cholesterol level -Lifestyle modification - Plan: Lipid panel  Essential hypertension  -controlled -Continue atenolol 25 mg daily, Lasix 20 mg daily, K-Dur 40 mEq -Continue lifestyle modifications -Patient encouraged to check BP at home - Plan: CMP with eGFR([facility])  Muscle cramps -Intermittent -Pt encouraged to continue K. Dur -We will obtain labs  F/u prn   Grier Mitts, MD

## 2019-11-13 LAB — HEMOGLOBIN A1C
Hgb A1c MFr Bld: 5.3 % of total Hgb (ref <5.7)
Mean Plasma Glucose: 105 (calc)
eAG (mmol/L): 5.8 (calc)

## 2019-11-13 LAB — CBC
HCT: 42.3 % (ref 35.0–45.0)
Hemoglobin: 13.6 g/dL (ref 11.7–15.5)
MCH: 28.3 pg (ref 27.0–33.0)
MCHC: 32.2 g/dL (ref 32.0–36.0)
MCV: 87.9 fL (ref 80.0–100.0)
MPV: 13.8 fL — ABNORMAL HIGH (ref 7.5–12.5)
Platelets: 188 10*3/uL (ref 140–400)
RBC: 4.81 10*6/uL (ref 3.80–5.10)
RDW: 13.2 % (ref 11.0–15.0)
WBC: 3.7 10*3/uL — ABNORMAL LOW (ref 3.8–10.8)

## 2019-11-13 LAB — COMPLETE METABOLIC PANEL WITH GFR
AG Ratio: 1.5 (calc) (ref 1.0–2.5)
ALT: 20 U/L (ref 6–29)
AST: 22 U/L (ref 10–35)
Albumin: 4.5 g/dL (ref 3.6–5.1)
Alkaline phosphatase (APISO): 64 U/L (ref 37–153)
BUN: 19 mg/dL (ref 7–25)
CO2: 23 mmol/L (ref 20–32)
Calcium: 9.8 mg/dL (ref 8.6–10.4)
Chloride: 106 mmol/L (ref 98–110)
Creat: 0.84 mg/dL (ref 0.50–0.99)
GFR, Est African American: 86 mL/min/{1.73_m2} (ref >=60)
GFR, Est Non African American: 74 mL/min/{1.73_m2} (ref >=60)
Globulin: 3 g/dL (calc) (ref 1.9–3.7)
Glucose, Bld: 106 mg/dL — ABNORMAL HIGH (ref 65–99)
Potassium: 4.4 mmol/L (ref 3.5–5.3)
Sodium: 140 mmol/L (ref 135–146)
Total Bilirubin: 0.5 mg/dL (ref 0.2–1.2)
Total Protein: 7.5 g/dL (ref 6.1–8.1)

## 2019-11-13 LAB — LIPID PANEL
Cholesterol: 187 mg/dL (ref <200)
HDL: 72 mg/dL (ref >=50)
LDL Cholesterol (Calc): 95 mg/dL (calc)
Non-HDL Cholesterol (Calc): 115 mg/dL (calc) (ref <130)
Total CHOL/HDL Ratio: 2.6 (calc) (ref <5.0)
Triglycerides: 108 mg/dL (ref <150)

## 2019-11-13 LAB — BRAIN NATRIURETIC PEPTIDE: Brain Natriuretic Peptide: 98 pg/mL (ref <100)

## 2019-11-13 LAB — TSH: TSH: 4.32 mIU/L (ref 0.40–4.50)

## 2020-06-01 DIAGNOSIS — Z1231 Encounter for screening mammogram for malignant neoplasm of breast: Secondary | ICD-10-CM | POA: Diagnosis not present

## 2020-06-01 DIAGNOSIS — Z6837 Body mass index (BMI) 37.0-37.9, adult: Secondary | ICD-10-CM | POA: Diagnosis not present

## 2020-06-01 DIAGNOSIS — Z01419 Encounter for gynecological examination (general) (routine) without abnormal findings: Secondary | ICD-10-CM | POA: Diagnosis not present

## 2020-11-16 ENCOUNTER — Other Ambulatory Visit: Payer: Self-pay | Admitting: Family Medicine

## 2020-11-16 DIAGNOSIS — I10 Essential (primary) hypertension: Secondary | ICD-10-CM

## 2020-11-16 DIAGNOSIS — M109 Gout, unspecified: Secondary | ICD-10-CM

## 2020-11-17 ENCOUNTER — Encounter: Payer: BC Managed Care – PPO | Admitting: Family Medicine

## 2020-11-23 ENCOUNTER — Other Ambulatory Visit: Payer: Self-pay

## 2020-11-24 ENCOUNTER — Ambulatory Visit (INDEPENDENT_AMBULATORY_CARE_PROVIDER_SITE_OTHER): Payer: BC Managed Care – PPO | Admitting: Family Medicine

## 2020-11-24 ENCOUNTER — Encounter: Payer: Self-pay | Admitting: Family Medicine

## 2020-11-24 VITALS — BP 132/82 | HR 56 | Temp 97.9°F | Ht 63.5 in | Wt 208.0 lb

## 2020-11-24 DIAGNOSIS — Z0000 Encounter for general adult medical examination without abnormal findings: Secondary | ICD-10-CM

## 2020-11-24 DIAGNOSIS — Z13220 Encounter for screening for lipoid disorders: Secondary | ICD-10-CM

## 2020-11-24 DIAGNOSIS — I10 Essential (primary) hypertension: Secondary | ICD-10-CM | POA: Diagnosis not present

## 2020-11-24 DIAGNOSIS — Z0001 Encounter for general adult medical examination with abnormal findings: Secondary | ICD-10-CM | POA: Diagnosis not present

## 2020-11-24 DIAGNOSIS — D229 Melanocytic nevi, unspecified: Secondary | ICD-10-CM

## 2020-11-24 DIAGNOSIS — D696 Thrombocytopenia, unspecified: Secondary | ICD-10-CM

## 2020-11-24 LAB — COMPREHENSIVE METABOLIC PANEL
ALT: 25 U/L (ref 0–35)
AST: 21 U/L (ref 0–37)
Albumin: 4.2 g/dL (ref 3.5–5.2)
Alkaline Phosphatase: 58 U/L (ref 39–117)
BUN: 16 mg/dL (ref 6–23)
CO2: 23 mEq/L (ref 19–32)
Calcium: 9.3 mg/dL (ref 8.4–10.5)
Chloride: 104 mEq/L (ref 96–112)
Creatinine, Ser: 0.87 mg/dL (ref 0.40–1.20)
GFR: 70.64 mL/min (ref >60.00)
Glucose, Bld: 87 mg/dL (ref 70–99)
Potassium: 4.2 mEq/L (ref 3.5–5.1)
Sodium: 138 mEq/L (ref 135–145)
Total Bilirubin: 0.7 mg/dL (ref 0.2–1.2)
Total Protein: 6.9 g/dL (ref 6.0–8.3)

## 2020-11-24 LAB — CBC WITH DIFFERENTIAL/PLATELET
Basophils Absolute: 0 10*3/uL (ref 0.0–0.1)
Basophils Relative: 0.4 % (ref 0.0–3.0)
Eosinophils Absolute: 0.1 10*3/uL (ref 0.0–0.7)
Eosinophils Relative: 1.8 % (ref 0.0–5.0)
HCT: 39.8 % (ref 36.0–46.0)
Hemoglobin: 12.7 g/dL (ref 12.0–15.0)
Lymphocytes Relative: 36.5 % (ref 12.0–46.0)
Lymphs Abs: 1.4 10*3/uL (ref 0.7–4.0)
MCHC: 32 g/dL (ref 30.0–36.0)
MCV: 89.6 fl (ref 78.0–100.0)
Monocytes Absolute: 0.3 10*3/uL (ref 0.1–1.0)
Monocytes Relative: 8.6 % (ref 3.0–12.0)
Neutro Abs: 2 10*3/uL (ref 1.4–7.7)
Neutrophils Relative %: 52.7 % (ref 43.0–77.0)
Platelets: 137 10*3/uL — ABNORMAL LOW (ref 150.0–400.0)
RBC: 4.44 Mil/uL (ref 3.87–5.11)
RDW: 14.5 % (ref 11.5–15.5)
WBC: 3.8 10*3/uL — ABNORMAL LOW (ref 4.0–10.5)

## 2020-11-24 LAB — LIPID PANEL
Cholesterol: 190 mg/dL (ref 0–200)
HDL: 66.9 mg/dL (ref >39.00)
LDL Cholesterol: 100 mg/dL — ABNORMAL HIGH (ref 0–99)
NonHDL: 122.93
Total CHOL/HDL Ratio: 3
Triglycerides: 113 mg/dL (ref 0.0–149.0)
VLDL: 22.6 mg/dL (ref 0.0–40.0)

## 2020-11-24 LAB — TSH: TSH: 4.91 u[IU]/mL (ref 0.35–5.50)

## 2020-11-24 LAB — T4, FREE: Free T4: 0.68 ng/dL (ref 0.60–1.60)

## 2020-11-24 LAB — VITAMIN D 25 HYDROXY (VIT D DEFICIENCY, FRACTURES): VITD: 46.17 ng/mL (ref 30.00–100.00)

## 2020-11-24 LAB — HEMOGLOBIN A1C: Hgb A1c MFr Bld: 5.7 % (ref 4.6–6.5)

## 2020-11-24 NOTE — Progress Notes (Signed)
Subjective:     Katie Richards is a 65 y.o. female and is here for a comprehensive physical exam. The patient reports no problems.  Pt states she has been doing well.  Notes a mole on L neck that gets caught in jewelry at times.  Pt inquires about removal.  Colonoscopy done 04/03/18.  Mammogram done 05/28/19.  Has appt with Dentist and Gynecology.  Social History   Socioeconomic History   Marital status: Married    Spouse name: Not on file   Number of children: 2   Years of education: Not on file   Highest education level: Not on file  Occupational History   Occupation: [employer]  Tobacco Use   Smoking status: Never   Smokeless tobacco: Never  Vaping Use   Vaping Use: Never used  Substance and Sexual Activity   Alcohol use: Yes    Alcohol/week: 2.0 standard drinks    Types: 2 Glasses of wine per week   Drug use: No   Sexual activity: Not on file  Other Topics Concern   Not on file  Social History Narrative   Not on file   Social Determinants of Health   Financial Resource Strain: Not on file  Food Insecurity: Not on file  Transportation Needs: Not on file  Physical Activity: Not on file  Stress: Not on file  Social Connections: Not on file  Intimate Partner Violence: Not on file   Health Maintenance  Topic Date Due   HIV Screening  Never done   Hepatitis C Screening  Never done   Zoster Vaccines- Shingrix (1 of 2) Never done   PAP SMEAR-Modifier  01/05/2018   INFLUENZA VACCINE  11/13/2020   MAMMOGRAM  05/27/2021   TETANUS/TDAP  04/27/2022   COLONOSCOPY (Pts 45-15yr Insurance coverage will need to be confirmed)  04/04/2023   COVID-19 Vaccine  Completed   Pneumococcal Vaccine [id number] old  Aged Out   HPV VACCINES  Aged Out    The following portions of the patient's history were reviewed and updated as appropriate: allergies, current medications, past family history, past medical history, past social history, past surgical history, and problem  list.  Review of Systems Pertinent items noted in HPI and remainder of comprehensive ROS otherwise negative.   Objective:    BP 132/82 (BP Location: Left Arm, Patient Position: Sitting, Cuff Size: Normal)   Pulse (!) 56   Temp 97.9 F (36.6 C) (Oral)   Ht 5' 3.5" (1.613 m)   Wt 208 lb (94.3 kg)   SpO2 99%   BMI 36.27 kg/m  General appearance: alert, cooperative, and no distress Head: Normocephalic, without obvious abnormality, atraumatic Eyes: conjunctivae/corneas clear. PERRL, EOM's intact. Fundi benign. Ears: normal TM's and external ear canals both ears Nose: Nares normal. Septum midline. Mucosa normal. No drainage or sinus tenderness. Throat: lips, mucosa, and tongue normal; teeth and gums normal Neck: no adenopathy, no carotid bruit, no JVD, supple, symmetrical, trachea midline, and thyroid not enlarged, symmetric, no tenderness/mass/nodules Lungs: clear to auscultation bilaterally Heart: regular rate and rhythm, S1, S2 normal, no murmur, click, rub or gallop Abdomen: soft, non-tender; bowel sounds normal; no masses,  no organomegaly Extremities: extremities normal, atraumatic, no cyanosis or edema Pulses: 2+ and symmetric Skin: Skin color, texture, turgor normal. No rashes.  Nevus of L lateral neck with small stalk.  Several flat nevi noted. Lymph nodes: Cervical, supraclavicular, and axillary nodes normal. Neurologic: Alert and oriented X 3, normal strength and tone. Normal  symmetric reflexes. Normal coordination and gait   Liquid nitrogen applied for 10-12 seconds to the skin lesion on L lateral neck.    Assessment:    Healthy female exam.      Plan:  Anticipatory guidance given including wearing seatbelts, smoke detectors in the home, increasing physical activity, increasing p.o. intake of water and vegetables. -Labs -Mammogram up-to-date done 05/28/2019 -Colonoscopy up-to-date done 04/03/2018 -Pap done by OB/GYN -Immunizations reviewed -Given handout -Next CPE  in 1 year  Essential hypertension  -elevated. Typically controlled. -recheck -continue atenolol 25 mg daily, lasix 20 mg daily, klor con 20 mEq -lifestyle modifications - Plan: CMP  Nevus -consent obtained.  Cryoablation of 4 mm nevus of L lateral neck.  Pt tolerated procedure well. -Liquid nitrogen applied for 10-12 sec to the skin lesion and the expected blistering or scabbing reaction explained. Do not pick at the areas. Patient reminded to expect hypopigmented scars from the procedure. Return if lesions fail to fully resolve. -given handout  Screening for cholesterol level  - Plan: Lipid panel  See After Visit Summary for Counseling Recommendations   F/u prn in 4-6 months  Grier Mitts, MD

## 2020-11-28 ENCOUNTER — Other Ambulatory Visit: Payer: Self-pay

## 2020-11-28 ENCOUNTER — Other Ambulatory Visit: Payer: Self-pay | Admitting: Family Medicine

## 2020-11-28 DIAGNOSIS — D696 Thrombocytopenia, unspecified: Secondary | ICD-10-CM

## 2020-11-28 DIAGNOSIS — I10 Essential (primary) hypertension: Secondary | ICD-10-CM

## 2020-12-19 ENCOUNTER — Other Ambulatory Visit (INDEPENDENT_AMBULATORY_CARE_PROVIDER_SITE_OTHER): Payer: BC Managed Care – PPO

## 2020-12-19 ENCOUNTER — Other Ambulatory Visit: Payer: Self-pay

## 2020-12-19 DIAGNOSIS — I872 Venous insufficiency (chronic) (peripheral): Secondary | ICD-10-CM

## 2020-12-19 LAB — CBC WITH DIFFERENTIAL/PLATELET
Basophils Absolute: 0 10*3/uL (ref 0.0–0.1)
Basophils Relative: 0.4 % (ref 0.0–3.0)
Eosinophils Absolute: 0.1 10*3/uL (ref 0.0–0.7)
Eosinophils Relative: 2.5 % (ref 0.0–5.0)
HCT: 38.3 % (ref 36.0–46.0)
Hemoglobin: 12.2 g/dL (ref 12.0–15.0)
Lymphocytes Relative: 39.5 % (ref 12.0–46.0)
Lymphs Abs: 1.5 10*3/uL (ref 0.7–4.0)
MCHC: 31.9 g/dL (ref 30.0–36.0)
MCV: 87.5 fl (ref 78.0–100.0)
Monocytes Absolute: 0.3 10*3/uL (ref 0.1–1.0)
Monocytes Relative: 7.4 % (ref 3.0–12.0)
Neutro Abs: 1.9 10*3/uL (ref 1.4–7.7)
Neutrophils Relative %: 50.2 % (ref 43.0–77.0)
Platelets: 136 10*3/uL — ABNORMAL LOW (ref 150.0–400.0)
RBC: 4.38 Mil/uL (ref 3.87–5.11)
RDW: 14.3 % (ref 11.5–15.5)
WBC: 3.7 10*3/uL — ABNORMAL LOW (ref 4.0–10.5)

## 2020-12-20 ENCOUNTER — Other Ambulatory Visit: Payer: Self-pay

## 2020-12-20 DIAGNOSIS — D72819 Decreased white blood cell count, unspecified: Secondary | ICD-10-CM

## 2020-12-20 DIAGNOSIS — D696 Thrombocytopenia, unspecified: Secondary | ICD-10-CM

## 2020-12-21 ENCOUNTER — Telehealth: Payer: Self-pay | Admitting: Physician Assistant

## 2020-12-21 NOTE — Telephone Encounter (Signed)
Scheduled appt per 9/7 referral. Pt is aware of appt date and time.

## 2020-12-27 ENCOUNTER — Inpatient Hospital Stay: Payer: BC Managed Care – PPO

## 2020-12-27 ENCOUNTER — Other Ambulatory Visit: Payer: Self-pay

## 2020-12-27 ENCOUNTER — Inpatient Hospital Stay: Payer: BC Managed Care – PPO | Attending: Physician Assistant | Admitting: Physician Assistant

## 2020-12-27 ENCOUNTER — Encounter: Payer: Self-pay | Admitting: Physician Assistant

## 2020-12-27 VITALS — BP 142/72 | HR 60 | Temp 98.0°F | Resp 17 | Ht 63.5 in | Wt 207.4 lb

## 2020-12-27 DIAGNOSIS — D72819 Decreased white blood cell count, unspecified: Secondary | ICD-10-CM

## 2020-12-27 DIAGNOSIS — Z90710 Acquired absence of both cervix and uterus: Secondary | ICD-10-CM | POA: Diagnosis not present

## 2020-12-27 DIAGNOSIS — I10 Essential (primary) hypertension: Secondary | ICD-10-CM | POA: Diagnosis not present

## 2020-12-27 DIAGNOSIS — D696 Thrombocytopenia, unspecified: Secondary | ICD-10-CM

## 2020-12-27 LAB — CBC WITH DIFFERENTIAL (CANCER CENTER ONLY)
Abs Immature Granulocytes: 0 K/uL (ref 0.00–0.07)
Basophils Absolute: 0 K/uL (ref 0.0–0.1)
Basophils Relative: 0 %
Eosinophils Absolute: 0.1 K/uL (ref 0.0–0.5)
Eosinophils Relative: 3 %
HCT: 37 % (ref 36.0–46.0)
Hemoglobin: 12.1 g/dL (ref 12.0–15.0)
Immature Granulocytes: 0 %
Lymphocytes Relative: 38 %
Lymphs Abs: 1.3 K/uL (ref 0.7–4.0)
MCH: 28.1 pg (ref 26.0–34.0)
MCHC: 32.7 g/dL (ref 30.0–36.0)
MCV: 86 fL (ref 80.0–100.0)
Monocytes Absolute: 0.3 K/uL (ref 0.1–1.0)
Monocytes Relative: 9 %
Neutro Abs: 1.8 K/uL (ref 1.7–7.7)
Neutrophils Relative %: 50 %
Platelet Count: 156 K/uL (ref 150–400)
RBC: 4.3 MIL/uL (ref 3.87–5.11)
RDW: 14.4 % (ref 11.5–15.5)
WBC Count: 3.5 K/uL — ABNORMAL LOW (ref 4.0–10.5)
nRBC: 0 % (ref 0.0–0.2)

## 2020-12-27 LAB — FOLATE: Folate: 9.2 ng/mL (ref >5.9)

## 2020-12-27 LAB — HEPATITIS B SURFACE ANTIGEN: Hepatitis B Surface Ag: NONREACTIVE

## 2020-12-27 LAB — HIV ANTIBODY (ROUTINE TESTING W REFLEX): HIV Screen 4th Generation wRfx: NONREACTIVE

## 2020-12-27 LAB — HEPATITIS B CORE ANTIBODY, TOTAL: Hep B Core Total Ab: NONREACTIVE

## 2020-12-27 LAB — VITAMIN B12: Vitamin B-12: 548 pg/mL (ref 180–914)

## 2020-12-27 LAB — SAVE SMEAR(SSMR), FOR PROVIDER SLIDE REVIEW

## 2020-12-27 NOTE — Progress Notes (Signed)
Katie Richards [phone]   [phone]  INITIAL CONSULT NOTE  Patient Care Team: Billie Ruddy, MD as PCP - General (Family Medicine)  Hematological/Oncological History 1) Labs from PCP, Dr. Grier Mitts -08/06/2017: WBC 3.9 (L), Hgb 12.6, Plt 178 -10/28/2018: WBC 3.6 (L), Hgb 13.4, Plt 170 -11/12/2019: WBC 3.7 (L), Hgb 13.6, Plt 188 -11/24/2020: WBC 3.8 (L), Hgb 12.7, Plt 137 (L) -12/19/2020: WBC 3.7 (L), Hgb 12.2, Plt 136 (L)  2) 12/27/2020: Establish care with Katie Query PA-C  CHIEF COMPLAINTS/PURPOSE OF CONSULTATION:  "Leukopenia and Thrombocytopenia "  HISTORY OF PRESENTING ILLNESS:  Katie Richards 64 y.o. female with medical history significant for hypertension, seasonal allergies, migraine headaches. Patient is unaccompanied for this visit.   On exam today, Katie Richards reports that her energy levels are unchanged. Overall, she is able to stay active and complete all her daily routines on her own. She has a good appetite but trying to eat healthier by grilling/baking her meals. She denies any nausea, vomiting or abdominal pain. Her bowel movements are regular without diarrhea or constipation. She denies easy bruising or signs of bleeding. She denies fevers, chills, night sweats, shortness of breath, chest pain or cough. She has no other complaints. Rest of the 10 point ROS is below.   MEDICAL HISTORY:  Past Medical History:  Diagnosis Date   Allergic rhinitis    Allergy    Anemia    History of   Arthritis    knees   Asthma    Childhood,    Cataract    forming    Headache    History of migraine headaches    Hypertension    SVD (spontaneous vaginal delivery)    x 3, 2 living    SURGICAL HISTORY: Past Surgical History:  Procedure Laterality Date   BILATERAL CARPAL TUNNEL RELEASE     BILATERAL SALPINGECTOMY Bilateral 06/25/2017   Procedure: BILATERAL SALPINGECTOMY;  Surgeon: Jerelyn Charles, MD;  Location: [facility];  Service:  Gynecology;  Laterality: Bilateral;   COLONOSCOPY     CYSTOSCOPY  06/25/2017   Procedure: CYSTOSCOPY;  Surgeon: Jerelyn Charles, MD;  Location: [facility];  Service: Gynecology;;   DENTAL SURGERY     HYSTEROSCOPY N/A 04/21/2015   Procedure: HYSTEROSCOPY WITH POLYPECTOMY;  Surgeon: Jerelyn Charles, MD;  Location: [facility];  Service: Gynecology;  Laterality: N/A;  WITH MYOSURE   LAPAROSCOPIC ASSISTED VAGINAL HYSTERECTOMY N/A 06/25/2017   Procedure: LAPAROSCOPIC ASSISTED VAGINAL HYSTERECTOMY;  Surgeon: Jerelyn Charles, MD;  Location: [facility];  Service: Gynecology;  Laterality: N/A;   OOPHORECTOMY Left 06/25/2017   Procedure: OOPHORECTOMY;  Surgeon: Jerelyn Charles, MD;  Location: [facility];  Service: Gynecology;  Laterality: Left;   reverese tubal ligation     TUBAL LIGATION      SOCIAL HISTORY: Social History   Socioeconomic History   Marital status: Married    Spouse name: Not on file   Number of children: 2   Years of education: Not on file   Highest education level: Not on file  Occupational History   Occupation: [employer]  Tobacco Use   Smoking status: Never   Smokeless tobacco: Never  Vaping Use   Vaping Use: Never used  Substance and Sexual Activity   Alcohol use: Yes    Alcohol/week: 2.0 standard drinks    Types: 2 Glasses of wine per week   Drug use: No   Sexual activity: Not on file  Other Topics Concern   Not on  file  Social History Narrative   Not on file   Social Determinants of Health   Financial Resource Strain: Not on file  Food Insecurity: Not on file  Transportation Needs: Not on file  Physical Activity: Not on file  Stress: Not on file  Social Connections: Not on file  Intimate Partner Violence: Not on file    FAMILY HISTORY: Family History  Problem Relation Age of Onset   Alcohol abuse Brother    Hypertension Mother    Stroke Mother    Stroke Brother    Prostate cancer Father    Hypertension Father    Pancreatic cancer Brother    Colon polyps Brother     Lung cancer Brother    Heart disease Sister    Colon cancer Neg Hx    Esophageal cancer Neg Hx    Diabetes Neg Hx    Breast cancer Neg Hx    Crohn's disease Neg Hx    Rectal cancer Neg Hx    Stomach cancer Neg Hx     ALLERGIES:  is allergic to sulfonamide derivatives.  MEDICATIONS:  Current Outpatient Medications  Medication Sig Dispense Refill   allopurinol (ZYLOPRIM) 100 MG tablet Take 1 tablet by mouth once daily 90 tablet 0   atenolol (TENORMIN) 25 MG tablet Take 1 tablet by mouth once daily 76 tablet 3   Cholecalciferol (VITAMIN D) 2000 units CAPS Take 1 capsule by mouth daily.     Elastic Bandages & Supports (MEDICAL COMPRESSION SOCKS) MISC 1 each by Does not apply route daily. 2 each 0   furosemide (LASIX) 20 MG tablet TAKE 1 TABLET BY MOUTH ONCE DAILY IN THE MORNING 76 tablet 3   potassium chloride SA (KLOR-CON) 20 MEQ tablet Take 2 tablets by mouth once daily 180 tablet 0   No current facility-administered medications for this visit.    REVIEW OF SYSTEMS:   Constitutional: ( - ) fevers, ( - )  chills , ( - ) night sweats Eyes: ( - ) blurriness of vision, ( - ) double vision, ( - ) watery eyes Ears, nose, mouth, throat, and face: ( - ) mucositis, ( - ) sore throat Respiratory: ( - ) cough, ( - ) dyspnea, ( - ) wheezes Cardiovascular: ( - ) palpitation, ( - ) chest discomfort, ( - ) lower extremity swelling Gastrointestinal:  ( - ) nausea, ( - ) heartburn, ( - ) change in bowel habits Skin: ( - ) abnormal skin rashes Lymphatics: ( - ) new lymphadenopathy, ( - ) easy bruising Neurological: ( - ) numbness, ( - ) tingling, ( - ) new weaknesses Behavioral/Psych: ( - ) mood change, ( - ) new changes  All other systems were reviewed with the patient and are negative.  PHYSICAL EXAMINATION: ECOG PERFORMANCE STATUS: 0 - Asymptomatic  Vitals:   12/27/20 1258  BP: (!) 142/72  Pulse: 60  Resp: 17  Temp: 98 F (36.7 C)  SpO2: 100%   Filed Weights   12/27/20 1258   Weight: 207 lb 6.4 oz (94.1 kg)    GENERAL: well appearing female in NAD  SKIN: skin color, texture, turgor are normal, no rashes or significant lesions EYES: conjunctiva are pink and non-injected, sclera clear OROPHARYNX: no exudate, no erythema; lips, buccal mucosa, and tongue normal  NECK: supple, non-tender LYMPH:  no palpable lymphadenopathy in the cervical, axillary or supraclavicular lymph nodes.  LUNGS: clear to auscultation and percussion with normal breathing effort HEART: regular rate & rhythm  and no murmurs and no lower extremity edema ABDOMEN: soft, non-tender, non-distended, normal bowel sounds Musculoskeletal: no cyanosis of digits and no clubbing  PSYCH: alert & oriented x 3, fluent speech NEURO: no focal motor/sensory deficits  LABORATORY DATA:  I have reviewed the data as listed CBC Latest Ref Rng & Units 12/27/2020 12/19/2020 11/24/2020  WBC 4.0 - 10.5 K/uL 3.5(L) 3.7(L) 3.8(L)  Hemoglobin 12.0 - 15.0 g/dL 12.1 12.2 12.7  Hematocrit 36.0 - 46.0 % 37.0 38.3 39.8  Platelets 150 - 400 K/uL 156 136.0(L) 137.0(L)    CMP Latest Ref Rng & Units 11/24/2020 11/12/2019 10/28/2018  Glucose 70 - 99 mg/dL 87 106(H) 87  BUN 6 - 23 mg/dL '16 19 22  ' Creatinine 0.40 - 1.20 mg/dL 0.87 0.84 0.86  Sodium 135 - 145 mEq/L 138 140 141  Potassium 3.5 - 5.1 mEq/L 4.2 4.4 4.2  Chloride 96 - 112 mEq/L 104 106 105  CO2 19 - 32 mEq/L '23 23 25  ' Calcium 8.4 - 10.5 mg/dL 9.3 9.8 9.5  Total Protein 6.0 - 8.3 g/dL 6.9 7.5 -  Total Bilirubin 0.2 - 1.2 mg/dL 0.7 0.5 -  Alkaline Phos 39 - 117 U/L 58 - -  AST 0 - 37 U/L 21 22 -  ALT 0 - 35 U/L 25 20 -   ASSESSMENT & PLAN Katie Richards is a 65 y.o. female who presents to the clinic for leukopenia and thrombocytopenia. Differentials for thrombocytopenia include liver disease, splenomegaly, infectious processes, nutritional deficiencies, immune mediated, pseudothrombocytopenia and bone marrow disorders. Differentials for leukopenia include benign  ethnic leukopenia, infectious etiology, nutritional etiology, inflammatory etiology, or medication.  Patient is not taking any medications that could cause leukopenia or thrombocytopenia.  Patient will proceed with workup today with labs. Checking CBC, vitamin B12, MMA, homocysteine, folate, hepatitis B and C serologies, HIV serology and save smear.   # Leukopenia: --Rule out infectious etiology with hepatitis B serologies, hepatitis C serologies, and HIV.  --Nutritional evaluation with  vitamin B12, MMA, homocysteine and folate.   --Repeat CBC and we will order peripheral blood film.  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    # Thrombocytopenia: --Workup above will evaluate for infectios process, nutritional deficiences and pseudothrombocytopenia. --If above workup is negative with persistent thrombocytopenia, need abdominal US to evaluate for liver disease and/or splenomegaly    Follow up: --RTC in 6 month with labs unless above workup requires further intervention.   Orders Placed This Encounter  Procedures   Hepatitis B core antibody, total    Standing Status:   Future    Number of Occurrences:   1    Standing Expiration Date:   12/27/2021   Hepatitis C antibody    Standing Status:   Future    Number of Occurrences:   1    Standing Expiration Date:   12/27/2021   Hepatitis B surface antigen    Standing Status:   Future    Number of Occurrences:   1    Standing Expiration Date:   12/27/2021   Hepatitis B surface antibody    Standing Status:   Future    Number of Occurrences:   1    Standing Expiration Date:   12/27/2021   HIV antibody (with reflex)    Standing Status:   Future    Number of Occurrences:   1    Standing Expiration Date:   12/27/2021   Vitamin B12    Standing Status:   Future  Number of Occurrences:   1    Standing Expiration Date:   12/27/2021   Methylmalonic acid, serum    Standing Status:   Future    Number of Occurrences:   1     Standing Expiration Date:   12/27/2021   Folate, Serum    Standing Status:   Future    Number of Occurrences:   1    Standing Expiration Date:   12/27/2021   Homocysteine, serum    Standing Status:   Future    Number of Occurrences:   1    Standing Expiration Date:   12/27/2021   Save Smear (SSMR)    Standing Status:   Future    Number of Occurrences:   1    Standing Expiration Date:   12/27/2021   CBC with Differential (Cancer Center Only)    Standing Status:   Future    Number of Occurrences:   1    Standing Expiration Date:   12/27/2021    All questions were answered. The patient knows to call the clinic with any problems, questions or concerns.  I have spent a total of 60 minutes minutes of face-to-face and non-face-to-face time, preparing to see the patient, obtaining and/or reviewing separately obtained history, performing a medically appropriate examination, counseling and educating the patient, ordering tests, documenting clinical information in the electronic health record, and care coordination.   Katie Query, PA-C Department of Hematology/Oncology [place] at [facility] Phone: [phone]  Patient was seen with Dr. Lorenso Courier.   I have read the above note and personally examined the patient. I agree with the assessment and plan as noted above.  Briefly Mrs. Meineke is a 65 year old patient who presents for evaluation of longstanding leukopenia and new onset mild thrombocytopenia. At this time will conduct a full workup to include nutritional etiologies, viral etiologies, and workup for liver disease. No clear indication for a bone marrow biopsy, but may need to consider if counts worsen. RTC in 6 months or sooner if an abnormality is noted in the above workup.    Ledell Peoples, MD Department of Hematology/Oncology [place] at [facility] Phone: [phone] Pager: [phone] Email: [email or web address]

## 2020-12-28 DIAGNOSIS — D72819 Decreased white blood cell count, unspecified: Secondary | ICD-10-CM | POA: Insufficient documentation

## 2020-12-28 DIAGNOSIS — D696 Thrombocytopenia, unspecified: Secondary | ICD-10-CM | POA: Insufficient documentation

## 2020-12-28 LAB — HEPATITIS B SURFACE ANTIBODY,QUALITATIVE: Hep B S Ab: NONREACTIVE

## 2020-12-28 LAB — HEPATITIS C ANTIBODY: HCV Ab: NONREACTIVE

## 2020-12-29 LAB — HOMOCYSTEINE: Homocysteine: 8.6 umol/L (ref 0.0–17.2)

## 2020-12-30 LAB — METHYLMALONIC ACID, SERUM: Methylmalonic Acid, Quantitative: 156 nmol/L (ref 0–378)

## 2021-01-01 ENCOUNTER — Telehealth: Payer: Self-pay | Admitting: Physician Assistant

## 2021-01-01 NOTE — Telephone Encounter (Signed)
I called [name] to review the lab results from 12/27/2020.  CBC revealed stable white blood cell count at 3.5.  Platelet counts were back to normal at 156K.  Hepatitis B and hepatitis C serologies were negative.  HIV serology was negative.  There is no evidence of vitamin B12 or folate deficiency.  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  I explained there is little to no clinical consequence.   We will plan to see the patient back in 6 months for repeat labs.  Patient is aware to reach out to our clinic if she has any new concerns.  Patient expressed understanding and satisfaction with the plan provided.

## 2021-02-01 ENCOUNTER — Other Ambulatory Visit: Payer: Self-pay

## 2021-02-02 ENCOUNTER — Ambulatory Visit (INDEPENDENT_AMBULATORY_CARE_PROVIDER_SITE_OTHER): Payer: BC Managed Care – PPO | Admitting: *Deleted

## 2021-02-02 DIAGNOSIS — Z23 Encounter for immunization: Secondary | ICD-10-CM | POA: Diagnosis not present

## 2021-02-17 ENCOUNTER — Other Ambulatory Visit: Payer: Self-pay | Admitting: Family Medicine

## 2021-02-17 DIAGNOSIS — M109 Gout, unspecified: Secondary | ICD-10-CM

## 2021-02-17 DIAGNOSIS — I10 Essential (primary) hypertension: Secondary | ICD-10-CM

## 2021-04-13 ENCOUNTER — Telehealth: Payer: Self-pay

## 2021-04-13 ENCOUNTER — Other Ambulatory Visit: Payer: BC Managed Care – PPO

## 2021-04-13 ENCOUNTER — Other Ambulatory Visit: Payer: Self-pay

## 2021-04-13 DIAGNOSIS — J029 Acute pharyngitis, unspecified: Secondary | ICD-10-CM

## 2021-04-13 NOTE — Progress Notes (Signed)
Pt came to get a COVID test after feel congested and having a sore throat for the past 2 days. Believe it is allergies since symptoms were relieved after taking Claritin, but wanted to make sure before she was around anyone.  COVID test was negative. Pt was notified

## 2021-04-13 NOTE — Telephone Encounter (Signed)
ERROR

## 2021-04-17 ENCOUNTER — Other Ambulatory Visit: Payer: Self-pay | Admitting: Family Medicine

## 2021-04-17 DIAGNOSIS — Z1231 Encounter for screening mammogram for malignant neoplasm of breast: Secondary | ICD-10-CM

## 2021-06-04 ENCOUNTER — Ambulatory Visit: Payer: BC Managed Care – PPO

## 2021-06-05 DIAGNOSIS — Z01419 Encounter for gynecological examination (general) (routine) without abnormal findings: Secondary | ICD-10-CM | POA: Diagnosis not present

## 2021-06-05 DIAGNOSIS — Z6838 Body mass index (BMI) 38.0-38.9, adult: Secondary | ICD-10-CM | POA: Diagnosis not present

## 2021-06-05 DIAGNOSIS — Z1231 Encounter for screening mammogram for malignant neoplasm of breast: Secondary | ICD-10-CM | POA: Diagnosis not present

## 2021-06-26 ENCOUNTER — Other Ambulatory Visit: Payer: Self-pay | Admitting: Physician Assistant

## 2021-06-26 DIAGNOSIS — D72819 Decreased white blood cell count, unspecified: Secondary | ICD-10-CM

## 2021-06-26 DIAGNOSIS — D696 Thrombocytopenia, unspecified: Secondary | ICD-10-CM

## 2021-06-27 ENCOUNTER — Ambulatory Visit: Payer: BC Managed Care – PPO | Admitting: Physician Assistant

## 2021-06-27 ENCOUNTER — Other Ambulatory Visit: Payer: Self-pay

## 2021-06-27 ENCOUNTER — Inpatient Hospital Stay (HOSPITAL_BASED_OUTPATIENT_CLINIC_OR_DEPARTMENT_OTHER): Payer: BC Managed Care – PPO | Admitting: Physician Assistant

## 2021-06-27 ENCOUNTER — Inpatient Hospital Stay: Payer: BC Managed Care – PPO | Attending: Physician Assistant

## 2021-06-27 ENCOUNTER — Other Ambulatory Visit: Payer: BC Managed Care – PPO

## 2021-06-27 VITALS — BP 153/86 | HR 56 | Temp 97.3°F | Resp 16 | Wt 210.1 lb

## 2021-06-27 DIAGNOSIS — Z90710 Acquired absence of both cervix and uterus: Secondary | ICD-10-CM | POA: Insufficient documentation

## 2021-06-27 DIAGNOSIS — Z800 Family history of malignant neoplasm of digestive organs: Secondary | ICD-10-CM | POA: Insufficient documentation

## 2021-06-27 DIAGNOSIS — D72819 Decreased white blood cell count, unspecified: Secondary | ICD-10-CM | POA: Diagnosis not present

## 2021-06-27 DIAGNOSIS — D696 Thrombocytopenia, unspecified: Secondary | ICD-10-CM

## 2021-06-27 DIAGNOSIS — Z8042 Family history of malignant neoplasm of prostate: Secondary | ICD-10-CM | POA: Insufficient documentation

## 2021-06-27 DIAGNOSIS — Z801 Family history of malignant neoplasm of trachea, bronchus and lung: Secondary | ICD-10-CM | POA: Insufficient documentation

## 2021-06-27 DIAGNOSIS — I10 Essential (primary) hypertension: Secondary | ICD-10-CM | POA: Insufficient documentation

## 2021-06-27 LAB — CMP (CANCER CENTER ONLY)
ALT: 19 U/L (ref 0–44)
AST: 20 U/L (ref 15–41)
Albumin: 4.1 g/dL (ref 3.5–5.0)
Alkaline Phosphatase: 53 U/L (ref 38–126)
Anion gap: 7 (ref 5–15)
BUN: 17 mg/dL (ref 8–23)
CO2: 27 mmol/L (ref 22–32)
Calcium: 9.4 mg/dL (ref 8.9–10.3)
Chloride: 104 mmol/L (ref 98–111)
Creatinine: 0.94 mg/dL (ref 0.44–1.00)
GFR, Estimated: >60 mL/min
Glucose, Bld: 95 mg/dL (ref 70–99)
Potassium: 3.8 mmol/L (ref 3.5–5.1)
Sodium: 138 mmol/L (ref 135–145)
Total Bilirubin: 0.5 mg/dL (ref 0.3–1.2)
Total Protein: 7.2 g/dL (ref 6.5–8.1)

## 2021-06-27 LAB — CBC WITH DIFFERENTIAL (CANCER CENTER ONLY)
Abs Immature Granulocytes: 0.01 10*3/uL (ref 0.00–0.07)
Basophils Absolute: 0 10*3/uL (ref 0.0–0.1)
Basophils Relative: 0 %
Eosinophils Absolute: 0.1 10*3/uL (ref 0.0–0.5)
Eosinophils Relative: 3 %
HCT: 38.7 % (ref 36.0–46.0)
Hemoglobin: 12.6 g/dL (ref 12.0–15.0)
Immature Granulocytes: 0 %
Lymphocytes Relative: 38 %
Lymphs Abs: 1.4 10*3/uL (ref 0.7–4.0)
MCH: 28.5 pg (ref 26.0–34.0)
MCHC: 32.6 g/dL (ref 30.0–36.0)
MCV: 87.6 fL (ref 80.0–100.0)
Monocytes Absolute: 0.3 10*3/uL (ref 0.1–1.0)
Monocytes Relative: 9 %
Neutro Abs: 1.8 10*3/uL (ref 1.7–7.7)
Neutrophils Relative %: 50 %
Platelet Count: 170 10*3/uL (ref 150–400)
RBC: 4.42 MIL/uL (ref 3.87–5.11)
RDW: 14.7 % (ref 11.5–15.5)
WBC Count: 3.6 10*3/uL — ABNORMAL LOW (ref 4.0–10.5)
nRBC: 0 % (ref 0.0–0.2)

## 2021-06-27 NOTE — Progress Notes (Signed)
?[facility] ?[phone]   [phone] ? ?PROGRESS NOTE ? ?Patient Care Team: ?Billie Ruddy, MD as PCP - General (Family Medicine) ? ?Hematological/Oncological History ?1) Labs from PCP, Dr. Grier Mitts ?-08/06/2017: WBC 3.9 (L), Hgb 12.6, Plt 178 ?-10/28/2018: WBC 3.6 (L), Hgb 13.4, Plt 170 ?-11/12/2019: WBC 3.7 (L), Hgb 13.6, Plt 188 ?-11/24/2020: WBC 3.8 (L), Hgb 12.7, Plt 137 (L) ?-12/19/2020: WBC 3.7 (L), Hgb 12.2, Plt 136 (L) ? ?2) 12/27/2020: Establish care with Dede Query PA-C ? ?CHIEF COMPLAINTS/PURPOSE OF CONSULTATION:  ?"Leukopenia and Thrombocytopenia " ? ?HISTORY OF PRESENTING ILLNESS:  ?Katie Richards is a 66 y.o. female who returns for follow-up for leukopenia and thrombocytopenia. Patient is unaccompanied for this visit ? ?On exam today, Katie Richards reports that she is doing well without any changes to her health.  She reports her energy levels are stable and she completes all her daily activities on her own.  She has a great appetite and denies any recent weight changes.  She denies nausea, vomiting or abdominal pain.  Her bowel habits are unchanged without any diarrhea or constipation. She denies easy bruising or signs of bleeding. She denies fevers, chills, night sweats, shortness of breath, chest pain or cough. She has no other complaints. Rest of the 10 point ROS is below.  ? ?MEDICAL HISTORY:  ?Past Medical History:  ?Diagnosis Date  ? Allergic rhinitis   ? Allergy   ? Anemia   ? History of  ? Arthritis   ? knees  ? Asthma   ? Childhood,   ? Cataract   ? forming   ? Headache   ? History of migraine headaches   ? Hypertension   ? SVD (spontaneous vaginal delivery)   ? x 3, 2 living  ? ? ?SURGICAL HISTORY: ?Past Surgical History:  ?Procedure Laterality Date  ? BILATERAL CARPAL TUNNEL RELEASE    ? BILATERAL SALPINGECTOMY Bilateral 06/25/2017  ? Procedure: BILATERAL SALPINGECTOMY;  Surgeon: Jerelyn Charles, MD;  Location: [facility];  Service: Gynecology;   Laterality: Bilateral;  ? COLONOSCOPY    ? CYSTOSCOPY  06/25/2017  ? Procedure: CYSTOSCOPY;  Surgeon: Jerelyn Charles, MD;  Location: [facility];  Service: Gynecology;;  ? DENTAL SURGERY    ? HYSTEROSCOPY N/A 04/21/2015  ? Procedure: HYSTEROSCOPY WITH POLYPECTOMY;  Surgeon: Jerelyn Charles, MD;  Location: [facility];  Service: Gynecology;  Laterality: N/A;  WITH MYOSURE  ? LAPAROSCOPIC ASSISTED VAGINAL HYSTERECTOMY N/A 06/25/2017  ? Procedure: LAPAROSCOPIC ASSISTED VAGINAL HYSTERECTOMY;  Surgeon: Jerelyn Charles, MD;  Location: [facility];  Service: Gynecology;  Laterality: N/A;  ? OOPHORECTOMY Left 06/25/2017  ? Procedure: OOPHORECTOMY;  Surgeon: Jerelyn Charles, MD;  Location: [facility];  Service: Gynecology;  Laterality: Left;  ? reverese tubal ligation    ? TUBAL LIGATION    ? ? ?SOCIAL HISTORY: ?Social History  ? ?Socioeconomic History  ? Marital status: Married  ?  Spouse name: Not on file  ? Number of children: 2  ? Years of education: Not on file  ? Highest education level: Not on file  ?Occupational History  ? Occupation: [employer]  ?Tobacco Use  ? Smoking status: Never  ? Smokeless tobacco: Never  ?Vaping Use  ? Vaping Use: Never used  ?Substance and Sexual Activity  ? Alcohol use: Yes  ?  Alcohol/week: 2.0 standard drinks  ?  Types: 2 Glasses of wine per week  ? Drug use: No  ? Sexual activity: Not on file  ?Other Topics  Concern  ? Not on file  ?Social History Narrative  ? Not on file  ? ?Social Determinants of Health  ? ?Financial Resource Strain: Not on file  ?Food Insecurity: Not on file  ?Transportation Needs: Not on file  ?Physical Activity: Not on file  ?Stress: Not on file  ?Social Connections: Not on file  ?Intimate Partner Violence: Not on file  ? ? ?FAMILY HISTORY: ?Family History  ?Problem Relation Age of Onset  ? Alcohol abuse Brother   ? Hypertension Mother   ? Stroke Mother   ? Stroke Brother   ? Prostate cancer Father   ? Hypertension Father   ? Pancreatic cancer Brother   ? Colon polyps Brother   ? Lung cancer  Brother   ? Heart disease Sister   ? Colon cancer Neg Hx   ? Esophageal cancer Neg Hx   ? Diabetes Neg Hx   ? Breast cancer Neg Hx   ? Crohn's disease Neg Hx   ? Rectal cancer Neg Hx   ? Stomach cancer Neg Hx   ? ? ?ALLERGIES:  is allergic to sulfonamide derivatives. ? ?MEDICATIONS:  ?Current Outpatient Medications  ?Medication Sig Dispense Refill  ? allopurinol (ZYLOPRIM) 100 MG tablet Take 1 tablet by mouth once daily 90 tablet 2  ? atenolol (TENORMIN) 25 MG tablet Take 1 tablet by mouth once daily 76 tablet 3  ? Cholecalciferol (VITAMIN D) 2000 units CAPS Take 1 capsule by mouth daily.    ? Elastic Bandages & Supports (MEDICAL COMPRESSION SOCKS) MISC 1 each by Does not apply route daily. 2 each 0  ? furosemide (LASIX) 20 MG tablet TAKE 1 TABLET BY MOUTH ONCE DAILY IN THE MORNING 76 tablet 3  ? potassium chloride SA (KLOR-CON) 20 MEQ tablet Take 2 tablets by mouth once daily 180 tablet 2  ? ?No current facility-administered medications for this visit.  ? ? ?REVIEW OF SYSTEMS:   ?Constitutional: ( - ) fevers, ( - )  chills , ( - ) night sweats ?Eyes: ( - ) blurriness of vision, ( - ) double vision, ( - ) watery eyes ?Ears, nose, mouth, throat, and face: ( - ) mucositis, ( - ) sore throat ?Respiratory: ( - ) cough, ( - ) dyspnea, ( - ) wheezes ?Cardiovascular: ( - ) palpitation, ( - ) chest discomfort, ( - ) lower extremity swelling ?Gastrointestinal:  ( - ) nausea, ( - ) heartburn, ( - ) change in bowel habits ?Skin: ( - ) abnormal skin rashes ?Lymphatics: ( - ) new lymphadenopathy, ( - ) easy bruising ?Neurological: ( - ) numbness, ( - ) tingling, ( - ) new weaknesses ?Behavioral/Psych: ( - ) mood change, ( - ) new changes  ?All other systems were reviewed with the patient and are negative. ? ?PHYSICAL EXAMINATION: ?ECOG PERFORMANCE STATUS: 0 - Asymptomatic ? ?Vitals:  ? 06/27/21 1017  ?BP: (!) 153/86  ?Pulse: (!) 56  ?Resp: 16  ?Temp: (!) 97.3 ?F (36.3 ?C)  ?SpO2: 100%  ? ?Filed Weights  ? 06/27/21 1017  ?Weight:  210 lb 1.6 oz (95.3 kg)  ? ? ?GENERAL: well appearing female in NAD  ?SKIN: skin color, texture, turgor are normal, no rashes or significant lesions ?EYES: conjunctiva are pink and non-injected, sclera clear ?LYMPH:  no palpable lymphadenopathy in the cervical or supraclavicular lymph nodes.  ?LUNGS: clear to auscultation and percussion with normal breathing effort ?HEART: regular rate & rhythm and no murmurs and no lower extremity edema ?Musculoskeletal:  no cyanosis of digits and no clubbing  ?PSYCH: alert & oriented x 3, fluent speech ?NEURO: no focal motor/sensory deficits ? ?LABORATORY DATA:  ?I have reviewed the data as listed ?CBC Latest Ref Rng & Units 06/27/2021 12/27/2020 12/19/2020  ?WBC 4.0 - 10.5 K/uL 3.6(L) 3.5(L) 3.7(L)  ?Hemoglobin 12.0 - 15.0 g/dL 12.6 12.1 12.2  ?Hematocrit 36.0 - 46.0 % 38.7 37.0 38.3  ?Platelets 150 - 400 K/uL 170 156 136.0(L)  ? ? ?CMP Latest Ref Rng & Units 11/24/2020 11/12/2019 10/28/2018  ?Glucose 70 - 99 mg/dL 87 106(H) 87  ?BUN 6 - 23 mg/dL _0 ?Creatinine 0.40 - 1.20 mg/dL 0.87 0.84 0.86  ?Sodium 135 - 145 mEq/L 138 140 141  ?Potassium 3.5 - 5.1 mEq/L 4.2 4.4 4.2  ?Chloride 96 - 112 mEq/L 104 106 105  ?CO2 19 - 32 mEq/L _1 ?Calcium 8.4 - 10.5 mg/dL 9.3 9.8 9.5  ?Total Protein 6.0 - 8.3 g/dL 6.9 7.5 -  ?Total Bilirubin 0.2 - 1.2 mg/dL 0.7 0.5 -  ?Alkaline Phos 39 - 117 U/L 58 - -  ?AST 0 - 37 U/L 21 22 -  ?ALT 0 - 35 U/L 25 20 -  ? ?ASSESSMENT & PLAN ?Katie Richards is a 66 y.o. female returns for a follow up for leukopenia and thrombocytopenia ? 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  ?--Workup from  9/14/203 ruled nutritional deficiencies, hepatitis B and C, HIV ?--Labs today show WBC 3.6 K/uL, stable for several years.  ?--No need for a bone marrow biopsy but consider if WBC worsens.  ?--No further workup at this time, continue to monitor.  ? ?# Thrombocytopenia, resolved: ?--Platelet count is within normal limits at  170 K/uL.  ?--Monitor for now. No additional workup is recommended at this time.  ? ?We discussed continued follow up by monitoring labs q 6-12 months with PCP, Dr. Grier Mitts. Patient was in agreement

## 2021-08-06 ENCOUNTER — Other Ambulatory Visit: Payer: Self-pay

## 2021-08-06 DIAGNOSIS — I10 Essential (primary) hypertension: Secondary | ICD-10-CM

## 2021-08-06 MED ORDER — ATENOLOL 25 MG PO TABS: 25.0000 mg | ORAL_TABLET | Freq: Every day | ORAL | 1 refills | Status: DC

## 2021-10-10 ENCOUNTER — Other Ambulatory Visit: Payer: Self-pay

## 2021-10-10 DIAGNOSIS — I10 Essential (primary) hypertension: Secondary | ICD-10-CM

## 2021-10-10 MED ORDER — FUROSEMIDE 20 MG PO TABS: 20.0000 mg | ORAL_TABLET | Freq: Every morning | ORAL | 0 refills | Status: DC

## 2021-10-15 ENCOUNTER — Other Ambulatory Visit: Payer: Self-pay | Admitting: Family Medicine

## 2021-10-15 DIAGNOSIS — I10 Essential (primary) hypertension: Secondary | ICD-10-CM

## 2021-11-06 ENCOUNTER — Telehealth: Payer: Self-pay | Admitting: Family Medicine

## 2021-11-06 ENCOUNTER — Telehealth (INDEPENDENT_AMBULATORY_CARE_PROVIDER_SITE_OTHER): Payer: BC Managed Care – PPO | Admitting: Family Medicine

## 2021-11-06 ENCOUNTER — Encounter: Payer: Self-pay | Admitting: Family Medicine

## 2021-11-06 VITALS — HR 95

## 2021-11-06 DIAGNOSIS — U071 COVID-19: Secondary | ICD-10-CM

## 2021-11-06 MED ORDER — NIRMATRELVIR/RITONAVIR (PAXLOVID)TABLET: 3.0000 | ORAL_TABLET | Freq: Two times a day (BID) | ORAL | 0 refills | Status: AC

## 2021-11-06 NOTE — Telephone Encounter (Signed)
error 

## 2021-11-06 NOTE — Progress Notes (Signed)
Patient ID: Katie Richards, female   DOB: [birth year], 66 y.o.   MRN: [id number]   Virtual Visit via Video Note  I connected with Katie Richards on 11/06/21 at 10:00 AM EDT by a video enabled telemedicine application and verified that I am speaking with the correct person using two identifiers.  Location patient: home Location provider:work or home office Persons participating in the virtual visit: patient, provider  I discussed the limitations of evaluation and management by telemedicine and the availability of in person appointments. The patient expressed understanding and agreed to proceed.   HPI: Katie Richards has COVID infection.  Her husband was diagnosed last week.  She developed symptoms Saturday of cough, sore throat, headache.  Mild nasal congestion.  She tested positive last night.  Some malaise and body aches.  No dyspnea.  Denies any nausea or vomiting.  No diarrhea.  GFR over 60 by labs few months ago.  She has history of hypertension and on low-dose atenolol.  Also has gout history on low-dose allopurinol.   ROS: See pertinent positives and negatives per HPI.  Past Medical History:  Diagnosis Date   Allergic rhinitis    Allergy    Anemia    History of   Arthritis    knees   Asthma    Childhood,    Cataract    forming    Headache    History of migraine headaches    Hypertension    SVD (spontaneous vaginal delivery)    x 3, 2 living    Past Surgical History:  Procedure Laterality Date   BILATERAL CARPAL TUNNEL RELEASE     BILATERAL SALPINGECTOMY Bilateral 06/25/2017   Procedure: BILATERAL SALPINGECTOMY;  Surgeon: Jerelyn Charles, MD;  Location: [facility];  Service: Gynecology;  Laterality: Bilateral;   COLONOSCOPY     CYSTOSCOPY  06/25/2017   Procedure: CYSTOSCOPY;  Surgeon: Jerelyn Charles, MD;  Location: [facility];  Service: Gynecology;;   DENTAL SURGERY     HYSTEROSCOPY N/A 04/21/2015   Procedure: HYSTEROSCOPY WITH POLYPECTOMY;  Surgeon: Jerelyn Charles, MD;  Location: [facility];  Service: Gynecology;  Laterality: N/A;  WITH MYOSURE   LAPAROSCOPIC ASSISTED VAGINAL HYSTERECTOMY N/A 06/25/2017   Procedure: LAPAROSCOPIC ASSISTED VAGINAL HYSTERECTOMY;  Surgeon: Jerelyn Charles, MD;  Location: [facility];  Service: Gynecology;  Laterality: N/A;   OOPHORECTOMY Left 06/25/2017   Procedure: OOPHORECTOMY;  Surgeon: Jerelyn Charles, MD;  Location: [facility];  Service: Gynecology;  Laterality: Left;   reverese tubal ligation     TUBAL LIGATION      Family History  Problem Relation Age of Onset   Alcohol abuse Brother    Hypertension Mother    Stroke Mother    Stroke Brother    Prostate cancer Father    Hypertension Father    Pancreatic cancer Brother    Colon polyps Brother    Lung cancer Brother    Heart disease Sister    Colon cancer Neg Hx    Esophageal cancer Neg Hx    Diabetes Neg Hx    Breast cancer Neg Hx    Crohn's disease Neg Hx    Rectal cancer Neg Hx    Stomach cancer Neg Hx     SOCIAL HX: Non-smoker   Current Outpatient Medications:    allopurinol (ZYLOPRIM) 100 MG tablet, Take 1 tablet by mouth once daily, Disp: 90 tablet, Rfl: 2   atenolol (TENORMIN) 25 MG tablet, Take 1 tablet by mouth once daily, Disp: 90 tablet, Rfl: 0  Cholecalciferol (VITAMIN D) 2000 units CAPS, Take 1 capsule by mouth daily., Disp: , Rfl:    Elastic Bandages & Supports (MEDICAL COMPRESSION SOCKS) MISC, 1 each by Does not apply route daily., Disp: 2 each, Rfl: 0   furosemide (LASIX) 20 MG tablet, Take 1 tablet (20 mg total) by mouth every morning., Disp: 76 tablet, Rfl: 0   potassium chloride SA (KLOR-CON) 20 MEQ tablet, Take 2 tablets by mouth once daily, Disp: 180 tablet, Rfl: 2  EXAM:  VITALS per patient if applicable:  GENERAL: alert, oriented, appears well and in no acute distress  HEENT: atraumatic, conjunttiva clear, no obvious abnormalities on inspection of external nose and ears  NECK: normal movements of the head and neck  LUNGS: on inspection no signs of  respiratory distress, breathing rate appears normal, no obvious gross SOB, gasping or wheezing  CV: no obvious cyanosis  MS: moves all visible extremities without noticeable abnormality  PSYCH/NEURO: pleasant and cooperative, no obvious depression or anxiety, speech and thought processing grossly intact  ASSESSMENT AND PLAN:  Discussed the following assessment and plan:  COVID-relatively mild symptoms at this time.  She has been fully vaccinated.  We did discuss pros and cons of antiviral therapy and she would like to consider trial of medication.  We will start Paxlovid 3 capsules twice daily for 5 days.  Follow-up for any persistent or worsening symptoms     I discussed the assessment and treatment plan with the patient. The patient was provided an opportunity to ask questions and all were answered. The patient agreed with the plan and demonstrated an understanding of the instructions.   The patient was advised to call back or seek an in-person evaluation if the symptoms worsen or if the condition fails to improve as anticipated.     Carolann Littler, MD

## 2021-12-01 ENCOUNTER — Other Ambulatory Visit: Payer: Self-pay | Admitting: Family Medicine

## 2021-12-01 DIAGNOSIS — M109 Gout, unspecified: Secondary | ICD-10-CM

## 2021-12-01 DIAGNOSIS — I10 Essential (primary) hypertension: Secondary | ICD-10-CM

## 2021-12-12 ENCOUNTER — Encounter: Payer: BC Managed Care – PPO | Admitting: Family Medicine

## 2021-12-13 ENCOUNTER — Encounter: Payer: BC Managed Care – PPO | Admitting: Family Medicine

## 2021-12-21 ENCOUNTER — Encounter: Payer: BC Managed Care – PPO | Admitting: Family Medicine

## 2021-12-21 ENCOUNTER — Ambulatory Visit (INDEPENDENT_AMBULATORY_CARE_PROVIDER_SITE_OTHER): Payer: Medicare Other | Admitting: Family Medicine

## 2021-12-21 VITALS — BP 142/82 | HR 64 | Temp 98.5°F | Ht 65.0 in | Wt 205.0 lb

## 2021-12-21 DIAGNOSIS — Z23 Encounter for immunization: Secondary | ICD-10-CM

## 2021-12-21 DIAGNOSIS — D696 Thrombocytopenia, unspecified: Secondary | ICD-10-CM | POA: Diagnosis not present

## 2021-12-21 DIAGNOSIS — Z0000 Encounter for general adult medical examination without abnormal findings: Secondary | ICD-10-CM

## 2021-12-21 DIAGNOSIS — Z780 Asymptomatic menopausal state: Secondary | ICD-10-CM

## 2021-12-21 DIAGNOSIS — I10 Essential (primary) hypertension: Secondary | ICD-10-CM

## 2021-12-21 DIAGNOSIS — E041 Nontoxic single thyroid nodule: Secondary | ICD-10-CM

## 2021-12-21 LAB — CBC WITH DIFFERENTIAL/PLATELET
Basophils Absolute: 0 10*3/uL (ref 0.0–0.1)
Basophils Relative: 0.6 % (ref 0.0–3.0)
Eosinophils Absolute: 0.1 10*3/uL (ref 0.0–0.7)
Eosinophils Relative: 1.8 % (ref 0.0–5.0)
HCT: 39.6 % (ref 36.0–46.0)
Hemoglobin: 12.7 g/dL (ref 12.0–15.0)
Lymphocytes Relative: 35.9 % (ref 12.0–46.0)
Lymphs Abs: 1.2 10*3/uL (ref 0.7–4.0)
MCHC: 32.1 g/dL (ref 30.0–36.0)
MCV: 88.9 fl (ref 78.0–100.0)
Monocytes Absolute: 0.4 10*3/uL (ref 0.1–1.0)
Monocytes Relative: 11.4 % (ref 3.0–12.0)
Neutro Abs: 1.7 10*3/uL (ref 1.4–7.7)
Neutrophils Relative %: 50.3 % (ref 43.0–77.0)
Platelets: 146 10*3/uL — ABNORMAL LOW (ref 150.0–400.0)
RBC: 4.45 Mil/uL (ref 3.87–5.11)
RDW: 15.5 % (ref 11.5–15.5)
WBC: 3.4 10*3/uL — ABNORMAL LOW (ref 4.0–10.5)

## 2021-12-21 LAB — BASIC METABOLIC PANEL
BUN: 17 mg/dL (ref 6–23)
CO2: 25 mEq/L (ref 19–32)
Calcium: 9.6 mg/dL (ref 8.4–10.5)
Chloride: 105 mEq/L (ref 96–112)
Creatinine, Ser: 0.9 mg/dL (ref 0.40–1.20)
GFR: 67.32 mL/min (ref >60.00)
Glucose, Bld: 86 mg/dL (ref 70–99)
Potassium: 4.5 mEq/L (ref 3.5–5.1)
Sodium: 139 mEq/L (ref 135–145)

## 2021-12-21 LAB — TSH: TSH: 3.68 u[IU]/mL (ref 0.35–5.50)

## 2021-12-21 LAB — T4, FREE: Free T4: 0.74 ng/dL (ref 0.60–1.60)

## 2021-12-21 NOTE — Patient Instructions (Signed)
Orders for labs were placed this visit.   An order was also placed for a bone density scan.  This is typically due around age 66 to screen for osteoporosis/osteopenia (weakening of your bones) which can happen in postmenopausal women.

## 2021-12-21 NOTE — Progress Notes (Signed)
Subjective:   Katie Richards is a 66 y.o. female who presents for an Initial Medicare Annual Wellness Visit.  Patient states she is doing well overall.  Endorses difficulty navigating Medicare plans.  Previously seen by heme-onc for thrombocytopenia.  Work-up negative.  Patient followed by [facility]on Anna for intermittent aches and pains.  Having regular dental and eye exams.  Denies difficulty with hearing, falls, depression.  Taking BP medications without issue.  Inquires about influenza vaccine.  Patient does not have formal advanced directives or HCPOA.  States her husband of 30+ years is aware of her wishes and would be making those decisions if needed.  Patient states she has paperwork at home that they plan to go through.  Recently purchased burial plots.  Review of Systems    General: Denies fever, chills, night sweats, changes in weight, changes in appetite HEENT: Denies headaches, ear pain, changes in vision, rhinorrhea, sore throat CV: Denies CP, palpitations, SOB, orthopnea Pulm: Denies SOB, cough, wheezing GI: Denies abdominal pain, nausea, vomiting, diarrhea, constipation GU: Denies dysuria, hematuria, frequency, vaginal discharge Msk: Denies muscle cramps, joint pains Neuro: Denies weakness, numbness, tingling Skin: Denies rashes, bruising Psych: Denies depression, anxiety, hallucinations     Objective:    Today's Vitals   12/21/21 0937  BP: (!) 142/82  Pulse: 64  Temp: 98.5 F (36.9 C)  TempSrc: Oral  SpO2: 98%  Weight: 205 lb (93 kg)  Height: '5\' 5"'$  (1.651 m)   Body mass index is 34.11 kg/m.  Gen. Pleasant, well developed, well-nourished, in NAD HEENT - [place]/AT, PERRL, EOMI, conjunctive clear, no scleral icterus, no nasal drainage, pharynx without erythema or exudate.  TMs full bilaterally. Neck: No JVD, a 3 mm round mobile nodule in right thyroid.  No carotid bruits, no cervical lymphadenopathy. Lungs: no use of accessory muscles, CTAB, no wheezes, rales or  rhonchi Cardiovascular: RRR,  No r/g/m, no peripheral edema Abdomen: BS present, soft, nontender,nondistended, no hepatosplenomegaly Musculoskeletal: No deformities, moves all four extremities, no cyanosis or clubbing, normal tone Neuro:  A&Ox3, CN II-XII intact, normal gait Skin:  Warm, dry, intact, no lesions Psych: normal affect, mood appropriate.      12/27/2020   12:59 PM 06/25/2017    6:40 PM 06/13/2017    8:56 AM 04/13/2015   10:02 AM 03/28/2014   12:59 PM  Advanced Directives  Does Patient Have a Medical Advance Directive? No No No No No  Would patient like information on creating a medical advance directive?  No - Patient declined Yes (MAU/Ambulatory/Procedural Areas - Information given)  No - patient declined information    Current Medications (verified) Outpatient Encounter Medications as of 12/21/2021  Medication Sig   allopurinol (ZYLOPRIM) 100 MG tablet Take 1 tablet by mouth once daily   atenolol (TENORMIN) 25 MG tablet Take 1 tablet by mouth once daily   Cholecalciferol (VITAMIN D) 2000 units CAPS Take 1 capsule by mouth daily.   Elastic Bandages & Supports (MEDICAL COMPRESSION SOCKS) MISC 1 each by Does not apply route daily.   furosemide (LASIX) 20 MG tablet Take 1 tablet (20 mg total) by mouth every morning.   potassium chloride SA (KLOR-CON M) 20 MEQ tablet Take 2 tablets by mouth once daily   No facility-administered encounter medications on file as of 12/21/2021.    Allergies (verified) Sulfonamide derivatives   History: Past Medical History:  Diagnosis Date   Allergic rhinitis    Allergy    Anemia    History of  Arthritis    knees   Asthma    Childhood,    Cataract    forming    Headache    History of migraine headaches    Hypertension    SVD (spontaneous vaginal delivery)    x 3, 2 living   Past Surgical History:  Procedure Laterality Date   BILATERAL CARPAL TUNNEL RELEASE     BILATERAL SALPINGECTOMY Bilateral 06/25/2017   Procedure:  BILATERAL SALPINGECTOMY;  Surgeon: Jerelyn Charles, MD;  Location: [facility];  Service: Gynecology;  Laterality: Bilateral;   COLONOSCOPY     CYSTOSCOPY  06/25/2017   Procedure: CYSTOSCOPY;  Surgeon: Jerelyn Charles, MD;  Location: [facility];  Service: Gynecology;;   DENTAL SURGERY     HYSTEROSCOPY N/A 04/21/2015   Procedure: HYSTEROSCOPY WITH POLYPECTOMY;  Surgeon: Jerelyn Charles, MD;  Location: [facility];  Service: Gynecology;  Laterality: N/A;  WITH MYOSURE   LAPAROSCOPIC ASSISTED VAGINAL HYSTERECTOMY N/A 06/25/2017   Procedure: LAPAROSCOPIC ASSISTED VAGINAL HYSTERECTOMY;  Surgeon: Jerelyn Charles, MD;  Location: [facility];  Service: Gynecology;  Laterality: N/A;   OOPHORECTOMY Left 06/25/2017   Procedure: OOPHORECTOMY;  Surgeon: Jerelyn Charles, MD;  Location: [facility];  Service: Gynecology;  Laterality: Left;   reverese tubal ligation     TUBAL LIGATION     Family History  Problem Relation Age of Onset   Alcohol abuse Brother    Hypertension Mother    Stroke Mother    Stroke Brother    Prostate cancer Father    Hypertension Father    Pancreatic cancer Brother    Colon polyps Brother    Lung cancer Brother    Heart disease Sister    Colon cancer Neg Hx    Esophageal cancer Neg Hx    Diabetes Neg Hx    Breast cancer Neg Hx    Crohn's disease Neg Hx    Rectal cancer Neg Hx    Stomach cancer Neg Hx    Social History   Socioeconomic History   Marital status: Married    Spouse name: Not on file   Number of children: 2   Years of education: Not on file   Highest education level: Not on file  Occupational History   Occupation: [employer]  Tobacco Use   Smoking status: Never   Smokeless tobacco: Never  Vaping Use   Vaping Use: Never used  Substance and Sexual Activity   Alcohol use: Yes    Alcohol/week: 2.0 standard drinks of alcohol    Types: 2 Glasses of wine per week   Drug use: No   Sexual activity: Not on file  Other Topics Concern   Not on file  Social History Narrative   Not on  file   Social Determinants of Health   Financial Resource Strain: Not on file  Food Insecurity: Not on file  Transportation Needs: Not on file  Physical Activity: Not on file  Stress: Not on file  Social Connections: Not on file    Tobacco Counseling Counseling given: Not Answered   How often do you need to have someone help you when you read instructions, pamphlets, or other written materials from your doctor or pharmacy?: (P) 1 - Never   Activities of Daily Living    12/21/2021    9:17 AM  In your present state of health, do you have any difficulty performing the following activities:  Hearing? 0  Vision? 0  Difficulty concentrating or making decisions? 0  Walking or climbing stairs?  0  Dressing or bathing? 0  Doing errands, shopping? 0  Preparing Food and eating ? N  Using the Toilet? N  In the past six months, have you accidently leaked urine? N  Do you have problems with loss of bowel control? N  Managing your Medications? N  Managing your Finances? N  Housekeeping or managing your Housekeeping? N    Patient Care Team: Billie Ruddy, MD as PCP - General (Family Medicine) [place], orthopedics Dentist-Dr. Ellouise Newer Caren Griffins, OB/GYN Optometrist-Dr. Lucy Antigua any recent Medical Services you may have received from other than [facility] providers in the past year (date may be approximate).       Assessment:   This is a routine wellness examination for Janith.  Hearing/Vision screen No results found.  Dietary issues and exercise activities discussed:     Goals Addressed   None    Depression Screen    11/06/2021    8:58 AM 11/24/2020    9:09 AM 10/28/2018   12:28 PM  PHQ 2/9 Scores  PHQ - 2 Score 0 0 0  PHQ- 9 Score  0     Fall Risk    12/21/2021    9:17 AM  Independence in the past year? 0  Number falls in past yr: 1  Injury with Fall? 0    FALL RISK PREVENTION PERTAINING TO THE HOME:  Any stairs in or around the home?  Yes  If so, are there any without handrails? No  Home free of loose throw rugs in walkways, pet beds, electrical cords, etc? Yes  Adequate lighting in your home to reduce risk of falls? Yes   ASSISTIVE DEVICES UTILIZED TO PREVENT FALLS:  Life alert? No  Use of a cane, walker or w/c? No  Grab bars in the bathroom? No  Shower chair or bench in shower? No  Elevated toilet seat or a handicapped toilet? No   TIMED UP AND GO:  Was the test performed? No .   Gait steady and fast without use of assistive device  Cognitive Function: A and O x3.  Denies difficulty with paying bills, forgetting to do things, getting lost.  Immunizations Immunization History  Administered Date(s) Administered   Influenza Split 03/05/2011, 02/24/2012   Influenza Whole 04/15/2005, 02/11/2007, 02/23/2009, 01/11/2010   Influenza,inj,Quad PF,6+ Mos 01/13/2013, 12/24/2013, 12/16/2014, 01/08/2016, 01/12/2017, 02/02/2021   Influenza-Unspecified 02/13/2018, 02/14/2019   Moderna Sars-Covid-2 Vaccination 06/25/2019, 07/16/2019, 03/06/2020, 08/30/2020   Td 04/15/2002   Tdap 04/27/2012    TDAP status: Up to date  Flu Vaccine status: Completed at today's visit  Pneumococcal vaccine status: Due, Education has been provided regarding the importance of this vaccine. Advised may receive this vaccine at local pharmacy or Health Dept. Aware to provide a copy of the vaccination record if obtained from local pharmacy or Health Dept. Verbalized acceptance and understanding.  Covid-19 vaccine status: Completed vaccines  Qualifies for Shingles Vaccine? Yes   Zostavax completed No   Shingrix Completed?: No.    Education has been provided regarding the importance of this vaccine. Patient has been advised to call insurance company to determine out of pocket expense if they have not yet received this vaccine. Advised may also receive vaccine at local pharmacy or Health Dept. Verbalized acceptance and understanding.  Screening  Tests Health Maintenance  Topic Date Due   Zoster Vaccines- Shingrix (1 of 2) Never done   PAP SMEAR-Modifier  01/05/2018   COVID-19 Vaccine (5 - Moderna series)  10/25/2020   MAMMOGRAM  05/27/2021   INFLUENZA VACCINE  11/13/2021   DEXA SCAN  Never done   Pneumonia Vaccine 49+ Years old (1 - PCV) 12/13/2021   TETANUS/TDAP  04/27/2022   COLONOSCOPY (Pts 45-19yr Insurance coverage will need to be confirmed)  04/04/2023   Hepatitis C Screening  Completed   HIV Screening  Completed   HPV VACCINES  Aged Out    Health Maintenance  Health Maintenance Due  Topic Date Due   Zoster Vaccines- Shingrix (1 of 2) Never done   PAP SMEAR-Modifier  01/05/2018   COVID-19 Vaccine (5 - Moderna series) 10/25/2020   MAMMOGRAM  05/27/2021   INFLUENZA VACCINE  11/13/2021   DEXA SCAN  Never done   Pneumonia Vaccine 65 Years old (1 - PCV) 12/13/2021    Colorectal cancer screening: Type of screening: Colonoscopy. Completed 04/03/2018. Repeat every 10 years  Mammogram status: Completed 05/28/2019. Repeat every year  Bone Density status: Ordered this visit. Pt provided with contact info and advised to call to schedule appt.  Lung Cancer Screening: (Low Dose CT Chest recommended if Age 702-80years, 30 pack-year currently smoking OR have quit w/in 15years.) does not qualify.   Lung Cancer Screening Referral: n/a  Additional Screening:  Hepatitis C Screening: does qualify; Completed 12/27/20  Vision Screening: Recommended annual ophthalmology exams for early detection of glaucoma and other disorders of the eye. Is the patient up to date with their annual eye exam?  Yes  Who is the provider or what is the name of the office in which the patient attends annual eye exams? Dr. WSherral HammersIf pt is not established with a provider, would they like to be referred to a provider to establish care? No .   Dental Screening: Recommended annual dental exams for proper oral hygiene  Community Resource Referral /  Chronic Care Management: CRR required this visit?  No   CCM required this visit?  No      Plan:     I have personally reviewed and noted the following in the patient's chart:   Medical and social history Use of alcohol, tobacco or illicit drugs  Current medications and supplements including opioid prescriptions. Patient is not currently taking opioid prescriptions. Functional ability and status Nutritional status Physical activity Advanced directives List of other physicians Hospitalizations, surgeries, and ER visits in previous 12 months Vitals Screenings to include cognitive, depression, and falls Referrals and appointments  Need for influenza vaccination  - Plan: Flu Vaccine QUAD High Dose(Fluad)  Thyroid nodule  -Noted on exam this visit -Obtain labs -Consider thyroid ultrasound - Plan: TSH, T4, Free  Essential hypertension -Elevated -Recheck -Continue current medications -Lifestyle modifications  - Plan: TSH, T4, Free, Basic metabolic panel  Thrombocytopenia (HDwale -Not concerning for hematology  - Plan: CBC with Differential/Platelet  Asymptomatic postmenopausal state  - Plan: DG Bone Density   In addition, I have reviewed and discussed with patient certain preventive protocols, quality metrics, and best practice recommendations. A written personalized care plan for preventive services as well as general preventive health recommendations were provided to patient.     SBillie Ruddy MD   12/21/2021

## 2022-01-09 ENCOUNTER — Other Ambulatory Visit: Payer: Self-pay | Admitting: Family Medicine

## 2022-01-09 DIAGNOSIS — I10 Essential (primary) hypertension: Secondary | ICD-10-CM

## 2022-01-19 ENCOUNTER — Other Ambulatory Visit: Payer: Self-pay | Admitting: Family Medicine

## 2022-01-19 DIAGNOSIS — I10 Essential (primary) hypertension: Secondary | ICD-10-CM

## 2022-02-01 ENCOUNTER — Telehealth: Payer: Self-pay | Admitting: Family Medicine

## 2022-02-01 NOTE — Telephone Encounter (Addendum)
Pt called to ask if someone could please call her back as soon as possible to review/change the coding used for a bill that she received in the mail ? Pt was told by Billing Dept. that she needed to speak to her PCP.   Pt states she is being charged:  $445.  Code used: Ppps = initial visit

## 2022-02-23 ENCOUNTER — Other Ambulatory Visit: Payer: Self-pay | Admitting: Family Medicine

## 2022-02-23 DIAGNOSIS — M109 Gout, unspecified: Secondary | ICD-10-CM

## 2022-03-14 ENCOUNTER — Other Ambulatory Visit: Payer: Self-pay | Admitting: Family Medicine

## 2022-03-14 DIAGNOSIS — I10 Essential (primary) hypertension: Secondary | ICD-10-CM

## 2022-05-22 ENCOUNTER — Other Ambulatory Visit: Payer: Self-pay | Admitting: Family Medicine

## 2022-05-22 DIAGNOSIS — I10 Essential (primary) hypertension: Secondary | ICD-10-CM

## 2022-05-24 ENCOUNTER — Other Ambulatory Visit: Payer: Self-pay | Admitting: Family Medicine

## 2022-05-24 DIAGNOSIS — M109 Gout, unspecified: Secondary | ICD-10-CM

## 2022-06-10 DIAGNOSIS — Z1231 Encounter for screening mammogram for malignant neoplasm of breast: Secondary | ICD-10-CM | POA: Diagnosis not present

## 2022-06-10 DIAGNOSIS — Z7689 Persons encountering health services in other specified circumstances: Secondary | ICD-10-CM | POA: Diagnosis not present

## 2022-06-10 LAB — HM MAMMOGRAPHY

## 2022-06-21 ENCOUNTER — Other Ambulatory Visit: Payer: Self-pay | Admitting: Family Medicine

## 2022-06-21 DIAGNOSIS — I10 Essential (primary) hypertension: Secondary | ICD-10-CM

## 2022-06-26 NOTE — Telephone Encounter (Signed)
Pt is calling checking on the status of potassium chloride SA (KLOR-CON M) 20 MEQ tablet . Pt is out

## 2022-06-26 NOTE — Telephone Encounter (Signed)
Pt is calling and said the visit in [place] should of been coded as welcome to medicare not initial and pt would like a callback

## 2022-07-04 NOTE — Telephone Encounter (Signed)
Patient informed of the message below.

## 2022-08-03 ENCOUNTER — Other Ambulatory Visit: Payer: Self-pay | Admitting: Family Medicine

## 2022-08-03 DIAGNOSIS — I10 Essential (primary) hypertension: Secondary | ICD-10-CM

## 2022-08-21 ENCOUNTER — Other Ambulatory Visit: Payer: Self-pay | Admitting: Family Medicine

## 2022-08-21 DIAGNOSIS — I10 Essential (primary) hypertension: Secondary | ICD-10-CM

## 2022-08-21 DIAGNOSIS — M109 Gout, unspecified: Secondary | ICD-10-CM

## 2022-09-14 ENCOUNTER — Other Ambulatory Visit: Payer: Self-pay | Admitting: Family Medicine

## 2022-09-14 DIAGNOSIS — I10 Essential (primary) hypertension: Secondary | ICD-10-CM

## 2022-11-04 ENCOUNTER — Other Ambulatory Visit: Payer: Self-pay | Admitting: Family Medicine

## 2022-11-04 DIAGNOSIS — I10 Essential (primary) hypertension: Secondary | ICD-10-CM

## 2022-11-22 ENCOUNTER — Other Ambulatory Visit: Payer: Self-pay | Admitting: Family Medicine

## 2022-11-22 DIAGNOSIS — I10 Essential (primary) hypertension: Secondary | ICD-10-CM

## 2022-11-28 ENCOUNTER — Encounter (INDEPENDENT_AMBULATORY_CARE_PROVIDER_SITE_OTHER): Payer: Self-pay

## 2022-12-02 ENCOUNTER — Other Ambulatory Visit: Payer: Self-pay | Admitting: Family Medicine

## 2022-12-02 DIAGNOSIS — M109 Gout, unspecified: Secondary | ICD-10-CM

## 2022-12-03 DIAGNOSIS — H5203 Hypermetropia, bilateral: Secondary | ICD-10-CM | POA: Diagnosis not present

## 2022-12-11 ENCOUNTER — Other Ambulatory Visit: Payer: Self-pay | Admitting: Family Medicine

## 2022-12-11 DIAGNOSIS — I10 Essential (primary) hypertension: Secondary | ICD-10-CM

## 2022-12-20 ENCOUNTER — Encounter: Payer: Self-pay | Admitting: Pharmacist

## 2022-12-25 ENCOUNTER — Ambulatory Visit (INDEPENDENT_AMBULATORY_CARE_PROVIDER_SITE_OTHER): Payer: Medicare HMO | Admitting: Family Medicine

## 2022-12-25 ENCOUNTER — Encounter: Payer: Self-pay | Admitting: Family Medicine

## 2022-12-25 VITALS — BP 136/86 | HR 52 | Temp 98.4°F | Ht 63.5 in | Wt 200.2 lb

## 2022-12-25 DIAGNOSIS — I10 Essential (primary) hypertension: Secondary | ICD-10-CM

## 2022-12-25 DIAGNOSIS — Z780 Asymptomatic menopausal state: Secondary | ICD-10-CM | POA: Diagnosis not present

## 2022-12-25 DIAGNOSIS — Z23 Encounter for immunization: Secondary | ICD-10-CM | POA: Diagnosis not present

## 2022-12-25 DIAGNOSIS — D696 Thrombocytopenia, unspecified: Secondary | ICD-10-CM

## 2022-12-25 DIAGNOSIS — R7303 Prediabetes: Secondary | ICD-10-CM | POA: Diagnosis not present

## 2022-12-25 DIAGNOSIS — Z0000 Encounter for general adult medical examination without abnormal findings: Secondary | ICD-10-CM | POA: Diagnosis not present

## 2022-12-25 LAB — CBC WITH DIFFERENTIAL/PLATELET
Basophils Absolute: 0 10*3/uL (ref 0.0–0.1)
Basophils Relative: 1.3 % (ref 0.0–3.0)
Eosinophils Absolute: 0.1 10*3/uL (ref 0.0–0.7)
Eosinophils Relative: 2 % (ref 0.0–5.0)
HCT: 41.4 % (ref 36.0–46.0)
Hemoglobin: 12.9 g/dL (ref 12.0–15.0)
Lymphocytes Relative: 37.6 % (ref 12.0–46.0)
Lymphs Abs: 1.2 10*3/uL (ref 0.7–4.0)
MCHC: 31.1 g/dL (ref 30.0–36.0)
MCV: 89.2 fl (ref 78.0–100.0)
Monocytes Absolute: 0.3 10*3/uL (ref 0.1–1.0)
Monocytes Relative: 8.1 % (ref 3.0–12.0)
Neutro Abs: 1.6 10*3/uL (ref 1.4–7.7)
Neutrophils Relative %: 51 % (ref 43.0–77.0)
Platelets: 164 10*3/uL (ref 150.0–400.0)
RBC: 4.64 Mil/uL (ref 3.87–5.11)
RDW: 14.5 % (ref 11.5–15.5)
WBC: 3.2 10*3/uL — ABNORMAL LOW (ref 4.0–10.5)

## 2022-12-25 LAB — COMPREHENSIVE METABOLIC PANEL
ALT: 22 U/L (ref 0–35)
AST: 25 U/L (ref 0–37)
Albumin: 4 g/dL (ref 3.5–5.2)
Alkaline Phosphatase: 59 U/L (ref 39–117)
BUN: 16 mg/dL (ref 6–23)
CO2: 24 meq/L (ref 19–32)
Calcium: 9.6 mg/dL (ref 8.4–10.5)
Chloride: 104 meq/L (ref 96–112)
Creatinine, Ser: 0.88 mg/dL (ref 0.40–1.20)
GFR: 68.67 mL/min (ref >60.00)
Glucose, Bld: 90 mg/dL (ref 70–99)
Potassium: 4.1 meq/L (ref 3.5–5.1)
Sodium: 138 meq/L (ref 135–145)
Total Bilirubin: 0.7 mg/dL (ref 0.2–1.2)
Total Protein: 7.2 g/dL (ref 6.0–8.3)

## 2022-12-25 LAB — LIPID PANEL
Cholesterol: 196 mg/dL (ref 0–200)
HDL: 71.3 mg/dL (ref >39.00)
LDL Cholesterol: 99 mg/dL (ref 0–99)
NonHDL: 124.58
Total CHOL/HDL Ratio: 3
Triglycerides: 130 mg/dL (ref 0.0–149.0)
VLDL: 26 mg/dL (ref 0.0–40.0)

## 2022-12-25 LAB — HEMOGLOBIN A1C: Hgb A1c MFr Bld: 5.8 % (ref 4.6–6.5)

## 2022-12-25 LAB — T4, FREE: Free T4: 0.84 ng/dL (ref 0.60–1.60)

## 2022-12-25 LAB — TSH: TSH: 4.42 u[IU]/mL (ref 0.35–5.50)

## 2022-12-25 MED ORDER — ATENOLOL 25 MG PO TABS
25.0000 mg | ORAL_TABLET | Freq: Every day | ORAL | 3 refills | Status: DC
Start: 2022-12-25 — End: 2024-01-14

## 2022-12-25 NOTE — Patient Instructions (Addendum)
Your prescription for atenolol was sent to the pharmacy with refills.  Your prior prescriptions were not sent with refills as they were filled by staff not by this provider.  Obtain a new blood pressure cuff.  Your blood pressure readings should be less than 140/90 (either number).  We will have you follow-up in the next month or so to see how your blood pressure is doing.  When you come in and bring your blood pressure cuff so that we can compare the readings.  Try checking your blood pressure at various times during the day to see if you notice any elevation by the evening.  Continue monitoring your sodium intake and exercising.   An order for bone density scan was placed.  You should receive a phone call about setting up this appointment.  You can also set up an appointment for annual wellness visit with the wellness nurse.  This can be done via phone or in person.

## 2022-12-25 NOTE — Progress Notes (Signed)
Established Patient Office Visit   Subjective  Patient ID: Katie Richards, female    DOB: Aug 20, 1955  Age: 67 y.o. MRN: [id number]  Chief Complaint  Patient presents with   Annual Exam    Patient is a 67 year old female seen for CPE.  Patient states she has been doing well overall.  Walking several times per week for exercise.  Notes weight loss.  Patient notes occasional elevation in BP per home cuff.  BP 140s over 80s.  Taking atenolol 25 mg daily for years.  Patient states she had mammogram this year.  Colonoscopy 5-year recall due December 2024.    Past Medical History:  Diagnosis Date   Allergic rhinitis    Allergy    Anemia    History of   Arthritis    knees   Asthma    Childhood,    Cataract    forming    Headache    History of migraine headaches    Hypertension    SVD (spontaneous vaginal delivery)    x 3, 2 living   Past Surgical History:  Procedure Laterality Date   BILATERAL CARPAL TUNNEL RELEASE     BILATERAL SALPINGECTOMY Bilateral 06/25/2017   Procedure: BILATERAL SALPINGECTOMY;  Surgeon: Marlow Baars, MD;  Location: [facility];  Service: Gynecology;  Laterality: Bilateral;   COLONOSCOPY     CYSTOSCOPY  06/25/2017   Procedure: CYSTOSCOPY;  Surgeon: Marlow Baars, MD;  Location: [facility];  Service: Gynecology;;   DENTAL SURGERY     HYSTEROSCOPY N/A 04/21/2015   Procedure: HYSTEROSCOPY WITH POLYPECTOMY;  Surgeon: Marlow Baars, MD;  Location: [facility];  Service: Gynecology;  Laterality: N/A;  WITH MYOSURE   LAPAROSCOPIC ASSISTED VAGINAL HYSTERECTOMY N/A 06/25/2017   Procedure: LAPAROSCOPIC ASSISTED VAGINAL HYSTERECTOMY;  Surgeon: Marlow Baars, MD;  Location: [facility];  Service: Gynecology;  Laterality: N/A;   OOPHORECTOMY Left 06/25/2017   Procedure: OOPHORECTOMY;  Surgeon: Marlow Baars, MD;  Location: [facility];  Service: Gynecology;  Laterality: Left;   reverese tubal ligation     TUBAL LIGATION     Social History   Tobacco Use   Smoking status: Never    Smokeless tobacco: Never  Vaping Use   Vaping status: Never Used  Substance Use Topics   Alcohol use: Yes    Alcohol/week: 2.0 standard drinks of alcohol    Types: 2 Glasses of wine per week   Drug use: No   Family History  Problem Relation Age of Onset   Alcohol abuse Brother    Hypertension Mother    Stroke Mother    Stroke Brother    Prostate cancer Father    Hypertension Father    Pancreatic cancer Brother    Colon polyps Brother    Lung cancer Brother    Heart disease Sister    Colon cancer Neg Hx    Esophageal cancer Neg Hx    Diabetes Neg Hx    Breast cancer Neg Hx    Crohn's disease Neg Hx    Rectal cancer Neg Hx    Stomach cancer Neg Hx    Allergies  Allergen Reactions   Sulfonamide Derivatives Hives      ROS Negative unless stated above    Objective:     BP 136/86 (BP Location: Left Arm, Patient Position: Sitting, Cuff Size: Normal)   Pulse (!) 52   Temp 98.4 F (36.9 C) (Oral)   Ht 5' 3.5" (1.613 m)   Wt 200 lb 3.2 oz (90.8 kg)  SpO2 98%   BMI 34.91 kg/m  BP Readings from Last 3 Encounters:  12/25/22 136/86  12/21/21 (!) 142/82  06/27/21 (!) 153/86   Wt Readings from Last 3 Encounters:  12/25/22 200 lb 3.2 oz (90.8 kg)  12/21/21 205 lb (93 kg)  06/27/21 210 lb 1.6 oz (95.3 kg)      Physical Exam Constitutional:      Appearance: Normal appearance.  HENT:     Head: Normocephalic and atraumatic.     Right Ear: Tympanic membrane, ear canal and external ear normal.     Left Ear: Tympanic membrane, ear canal and external ear normal.     Nose: Nose normal.     Mouth/Throat:     Mouth: Mucous membranes are moist.     Pharynx: No oropharyngeal exudate or posterior oropharyngeal erythema.  Eyes:     General: No scleral icterus.    Extraocular Movements: Extraocular movements intact.     Conjunctiva/sclera: Conjunctivae normal.     Pupils: Pupils are equal, round, and reactive to light.  Neck:     Thyroid: No thyromegaly.   Cardiovascular:     Rate and Rhythm: Normal rate and regular rhythm.     Pulses: Normal pulses.     Heart sounds: Normal heart sounds. No murmur heard.    No friction rub.  Pulmonary:     Effort: Pulmonary effort is normal.     Breath sounds: Normal breath sounds. No wheezing, rhonchi or rales.  Abdominal:     General: Bowel sounds are normal.     Palpations: Abdomen is soft.     Tenderness: There is no abdominal tenderness.  Musculoskeletal:        General: No deformity. Normal range of motion.  Lymphadenopathy:     Cervical: No cervical adenopathy.  Skin:    General: Skin is warm and dry.     Findings: No lesion.  Neurological:     General: No focal deficit present.     Mental Status: She is alert and oriented to person, place, and time.  Psychiatric:        Mood and Affect: Mood normal.        Thought Content: Thought content normal.      No results found for any visits on 12/25/22.    Assessment & Plan:  Well adult exam  Asymptomatic postmenopausal state -     DG Bone Density; Future  Essential hypertension -     TSH -     T4, free -     Lipid panel -     Comprehensive metabolic panel -     Atenolol; Take 1 tablet (25 mg total) by mouth daily.  Dispense: 90 tablet; Refill: 3  Thrombocytopenia (HCC) -     CBC with Differential/Platelet  Need for influenza vaccination -     Flu Vaccine Trivalent High Dose (Fluad)  Prediabetes -     Hemoglobin A1c  Age-appropriate health screenings discussed.  Will obtain labs.  Immunizations reviewed.  Patient given influenza vaccine this visit.  Mammogram done this year per patient.  Patient will set up Tdap, updated COVID, pneumonia, and shingles vaccines at local pharmacy.  Continue lifestyle modifications.  Order for bone density scan placed.  Colonoscopy done 04/03/2018, repeat due 03/2023.  Return in about 3 weeks (around 01/15/2023) for blood pressure re-check.   Deeann Saint, MD

## 2023-01-15 ENCOUNTER — Other Ambulatory Visit: Payer: Self-pay | Admitting: Family Medicine

## 2023-01-15 DIAGNOSIS — I10 Essential (primary) hypertension: Secondary | ICD-10-CM

## 2023-02-27 ENCOUNTER — Other Ambulatory Visit: Payer: Self-pay | Admitting: Family Medicine

## 2023-02-27 DIAGNOSIS — M109 Gout, unspecified: Secondary | ICD-10-CM

## 2023-03-06 ENCOUNTER — Encounter: Payer: Self-pay | Admitting: Gastroenterology

## 2023-03-11 ENCOUNTER — Telehealth: Payer: Self-pay | Admitting: Family Medicine

## 2023-03-11 DIAGNOSIS — I10 Essential (primary) hypertension: Secondary | ICD-10-CM

## 2023-03-11 MED ORDER — FUROSEMIDE 20 MG PO TABS: 20.0000 mg | ORAL_TABLET | Freq: Every morning | ORAL | 2 refills | Status: DC

## 2023-03-11 MED ORDER — POTASSIUM CHLORIDE CRYS ER 20 MEQ PO TBCR: 40.0000 meq | EXTENDED_RELEASE_TABLET | Freq: Every day | ORAL | 2 refills | Status: DC

## 2023-03-11 NOTE — Telephone Encounter (Signed)
Prescription Request  03/11/2023  LOV: 12/25/2022  What is the name of the medication or equipment? potassium chloride SA (KLOR-CON M) 20 MEQ tablet , furosemide (LASIX) 20 MG tablet   Have you contacted your pharmacy to request a refill? No   Which pharmacy would you like this sent to?  [facility] [address] [place], [place] - [address] [place] [address] Phone: [phone] Fax: [phone]    Patient notified that their request is being sent to the clinical staff for review and that they should receive a response within 2 business days.   Please advise at Mobile [phone] (mobile)

## 2023-03-11 NOTE — Telephone Encounter (Signed)
Rxs sent

## 2023-04-07 ENCOUNTER — Ambulatory Visit (AMBULATORY_SURGERY_CENTER): Payer: Medicare HMO

## 2023-04-07 VITALS — Ht 63.0 in | Wt 193.0 lb

## 2023-04-07 DIAGNOSIS — Z1211 Encounter for screening for malignant neoplasm of colon: Secondary | ICD-10-CM

## 2023-04-07 MED ORDER — NA SULFATE-K SULFATE-MG SULF 17.5-3.13-1.6 GM/177ML PO SOLN
1.0000 | Freq: Once | ORAL | 0 refills | Status: AC
Start: 2023-04-07 — End: 2023-04-07

## 2023-04-07 NOTE — Progress Notes (Signed)

## 2023-04-21 ENCOUNTER — Ambulatory Visit (INDEPENDENT_AMBULATORY_CARE_PROVIDER_SITE_OTHER): Payer: Medicare HMO

## 2023-04-21 VITALS — Ht 63.5 in | Wt 190.0 lb

## 2023-04-21 DIAGNOSIS — Z0000 Encounter for general adult medical examination without abnormal findings: Secondary | ICD-10-CM

## 2023-04-21 NOTE — Patient Instructions (Addendum)
 Ms. Gilberti , Thank you for taking time to come for your Medicare Wellness Visit. I appreciate your ongoing commitment to your health goals. Please review the following plan we discussed and let me know if I can assist you in the future.   Referrals/Orders/Follow-Ups/Clinician Recommendations:   This is a list of the screening recommended for you and due dates:  Health Maintenance  Topic Date Due   Zoster (Shingles) Vaccine (1 of 2) Never done   DEXA scan (bone density measurement)  Never done   COVID-19 Vaccine (5 - 2024-25 season) 12/15/2022   Colon Cancer Screening  04/04/2023   Pneumonia Vaccine (1 of 1 - PCV) 12/25/2023*   Medicare Annual Wellness Visit  04/20/2024   Mammogram  03/15/2025   Flu Shot  Completed   Hepatitis C Screening  Completed   HPV Vaccine  Aged Out   DTaP/Tdap/Td vaccine  Discontinued  *Topic was postponed. The date shown is not the original due date.    Advanced directives: (Declined) Advance directive discussed with you today. Even though you declined this today, please call our office should you change your mind, and we can give you the proper paperwork for you to fill out.  Next Medicare Annual Wellness Visit scheduled for next year: Yes

## 2023-04-21 NOTE — Progress Notes (Signed)
 Subjective:   Katie Richards is a 68 y.o. female who presents for Medicare Annual (Subsequent) preventive examination.  Visit Complete: Virtual I connected with  Katie Richards on 04/21/23 by a audio enabled telemedicine application and verified that I am speaking with the correct person using two identifiers.  Patient Location: Home  Provider Location: Home Office  I discussed the limitations of evaluation and management by telemedicine. The patient expressed understanding and agreed to proceed.  Vital Signs: Because this visit was a virtual/telehealth visit, some criteria may be missing or patient reported. Any vitals not documented were not able to be obtained and vitals that have been documented are patient reported.    Cardiac Risk Factors include: advanced age (>46men, >88 women);hypertension     Objective:    Today's Vitals   04/21/23 1549  Weight: 190 lb (86.2 kg)  Height: 5' 3.5 (1.613 m)   Body mass index is 33.13 kg/m.     04/21/2023    3:55 PM 12/27/2020   12:59 PM 06/25/2017    6:40 PM 06/13/2017    8:56 AM 04/13/2015   10:02 AM 03/28/2014   12:59 PM  Advanced Directives  Does Patient Have a Medical Advance Directive? No No No No No No  Would patient like information on creating a medical advance directive? No - Patient declined  No - Patient declined Yes (MAU/Ambulatory/Procedural Areas - Information given)  No - patient declined information    Current Medications (verified) Outpatient Encounter Medications as of 04/21/2023  Medication Sig   allopurinol  (ZYLOPRIM ) 100 MG tablet Take 1 tablet by mouth once daily   atenolol  (TENORMIN ) 25 MG tablet Take 1 tablet (25 mg total) by mouth daily.   Cholecalciferol (VITAMIN D ) 2000 units CAPS Take 1 capsule by mouth daily.   Elastic Bandages & Supports (MEDICAL COMPRESSION SOCKS) MISC 1 each by Does not apply route daily.   furosemide  (LASIX ) 20 MG tablet Take 1 tablet (20 mg total) by mouth every morning.    potassium chloride  SA (KLOR-CON  M) 20 MEQ tablet Take 2 tablets (40 mEq total) by mouth daily.   No facility-administered encounter medications on file as of 04/21/2023.    Allergies (verified) Sulfonamide derivatives   History: Past Medical History:  Diagnosis Date   Allergic rhinitis    Allergy    Anemia    History of   Arthritis    knees   Asthma    Childhood,    Cataract    forming    Headache    History of migraine headaches    Hypertension    SVD (spontaneous vaginal delivery)    x 3, 2 living   Past Surgical History:  Procedure Laterality Date   BILATERAL CARPAL TUNNEL RELEASE     BILATERAL SALPINGECTOMY Bilateral 06/25/2017   Procedure: BILATERAL SALPINGECTOMY;  Surgeon: Gretta Gums, MD;  Location: [facility];  Service: Gynecology;  Laterality: Bilateral;   COLONOSCOPY     CYSTOSCOPY  06/25/2017   Procedure: CYSTOSCOPY;  Surgeon: Gretta Gums, MD;  Location: [facility];  Service: Gynecology;;   DENTAL SURGERY     HYSTEROSCOPY N/A 04/21/2015   Procedure: HYSTEROSCOPY WITH POLYPECTOMY;  Surgeon: Gums Gretta, MD;  Location: [facility];  Service: Gynecology;  Laterality: N/A;  WITH MYOSURE   LAPAROSCOPIC ASSISTED VAGINAL HYSTERECTOMY N/A 06/25/2017   Procedure: LAPAROSCOPIC ASSISTED VAGINAL HYSTERECTOMY;  Surgeon: Gretta Gums, MD;  Location: [facility];  Service: Gynecology;  Laterality: N/A;   OOPHORECTOMY Left 06/25/2017   Procedure:  OOPHORECTOMY;  Surgeon: Gretta Gums, MD;  Location: [facility];  Service: Gynecology;  Laterality: Left;   reverese tubal ligation     TUBAL LIGATION     Family History  Problem Relation Age of Onset   Hypertension Mother    Stroke Mother    Prostate cancer Father    Hypertension Father    Heart disease Sister    Alcohol abuse Brother    Stroke Brother    Pancreatic cancer Brother    Colon cancer Brother    Colon polyps Brother    Lung cancer Brother    Esophageal cancer Neg Hx    Diabetes Neg Hx    Breast cancer Neg Hx    Crohn's disease  Neg Hx    Rectal cancer Neg Hx    Stomach cancer Neg Hx    Social History   Socioeconomic History   Marital status: Married    Spouse name: Not on file   Number of children: 2   Years of education: Not on file   Highest education level: Not on file  Occupational History   Occupation: [employer]  Tobacco Use   Smoking status: Never   Smokeless tobacco: Never  Vaping Use   Vaping status: Never Used  Substance and Sexual Activity   Alcohol use: Yes    Alcohol/week: 2.0 standard drinks of alcohol    Types: 2 Glasses of wine per week    Comment: ocassional   Drug use: No   Sexual activity: Not on file  Other Topics Concern   Not on file  Social History Narrative   Not on file   Social Drivers of Health   Financial Resource Strain: Low Risk  (04/21/2023)   Overall Financial Resource Strain (CARDIA)    Difficulty of Paying Living Expenses: Not hard at all  Food Insecurity: No Food Insecurity (04/21/2023)   Hunger Vital Sign    Worried About Running Out of Food in the Last Year: Never true    Ran Out of Food in the Last Year: Never true  Transportation Needs: No Transportation Needs (04/21/2023)   PRAPARE - Administrator, Civil Service (Medical): No    Lack of Transportation (Non-Medical): No  Physical Activity: Sufficiently Active (04/21/2023)   Exercise Vital Sign    Days of Exercise per Week: 4 days    Minutes of Exercise per Session: 90 min  Stress: No Stress Concern Present (04/21/2023)   [employer] of Occupational Health - Occupational Stress Questionnaire    Feeling of Stress : Not at all  Social Connections: Socially Integrated (04/21/2023)   Social Connection and Isolation Panel [NHANES]    Frequency of Communication with Friends and Family: More than three times a week    Frequency of Social Gatherings with Friends and Family: More than three times a week    Attends Religious Services: More than 4 times per year    Active Member of [employer] or  Organizations: Yes    Attends Engineer, Structural: More than 4 times per year    Marital Status: Married    Tobacco Counseling Counseling given: Not Answered   Clinical Intake:  Pre-visit preparation completed: Yes  Pain : No/denies pain     BMI - recorded: 33.13 Nutritional Status: BMI > 30  Obese Nutritional Risks: None Diabetes: No  How often do you need to have someone help you when you read instructions, pamphlets, or other written materials from your doctor or pharmacy?:  1 - Never  Interpreter Needed?: No  Information entered by :: Rojelio Blush LPN   Activities of Daily Living    04/21/2023    3:54 PM  In your present state of health, do you have any difficulty performing the following activities:  Hearing? 0  Vision? 0  Difficulty concentrating or making decisions? 0  Walking or climbing stairs? 0  Dressing or bathing? 0  Doing errands, shopping? 0  Preparing Food and eating ? N  Using the Toilet? N  In the past six months, have you accidently leaked urine? N  Do you have problems with loss of bowel control? N  Managing your Medications? N  Managing your Finances? N  Housekeeping or managing your Housekeeping? N    Patient Care Team: Mercer Clotilda SAUNDERS, MD as PCP - General (Family Medicine)  Indicate any recent Medical Services you may have received from other than [facility] providers in the past year (date may be approximate).     Assessment:   This is a routine wellness examination for Yuval.  Hearing/Vision screen Hearing Screening - Comments:: Denies hearing difficulties   Vision Screening - Comments:: Wears rx glasses - up to date with routine eye exams with  Dr Milissa   Goals Addressed               This Visit's Progress     Increase physical activity (pt-stated)         Depression Screen    04/21/2023    3:54 PM 11/06/2021    8:58 AM 11/24/2020    9:09 AM 10/28/2018   12:28 PM  PHQ 2/9 Scores  PHQ - 2 Score 0 0 0 0  PHQ- 9  Score   0     Fall Risk    04/21/2023    3:54 PM 12/21/2021    9:17 AM  Fall Risk   Falls in the past year? 0 0  Number falls in past yr: 0 1  Injury with Fall? 0 0  Risk for fall due to : No Fall Risks   Follow up Falls prevention discussed     MEDICARE RISK AT HOME: Medicare Risk at Home Any stairs in or around the home?: Yes If so, are there any without handrails?: No Home free of loose throw rugs in walkways, pet beds, electrical cords, etc?: Yes Adequate lighting in your home to reduce risk of falls?: Yes Life alert?: No Use of a cane, walker or w/c?: No Grab bars in the bathroom?: No Shower chair or bench in shower?: Yes Elevated toilet seat or a handicapped toilet?: No  TIMED UP AND GO:  Was the test performed?  No    Cognitive Function:        04/21/2023    3:55 PM  6CIT Screen  What Year? 0 points  What month? 0 points  What time? 0 points  Count back from 20 0 points  Months in reverse 0 points  Repeat phrase 0 points  Total Score 0 points    Immunizations Immunization History  Administered Date(s) Administered   Fluad Quad(high Dose 65+) 12/21/2021   Fluad Trivalent(High Dose 65+) 12/25/2022   Influenza Split 03/05/2011, 02/24/2012   Influenza Whole 04/15/2005, 02/11/2007, 02/23/2009, 01/11/2010   Influenza,inj,Quad PF,6+ Mos 01/13/2013, 12/24/2013, 12/16/2014, 01/08/2016, 01/12/2017, 02/02/2021   Influenza-Unspecified 02/13/2018, 02/14/2019   Moderna Sars-Covid-2 Vaccination 06/25/2019, 07/16/2019, 03/06/2020, 08/30/2020   Td 04/15/2002   Tdap 04/27/2012      Flu Vaccine status: Up to date  Pneumococcal vaccine status: Due, Education has been provided regarding the importance of this vaccine. Advised may receive this vaccine at local pharmacy or Health Dept. Aware to provide a copy of the vaccination record if obtained from local pharmacy or Health Dept. Verbalized acceptance and understanding.  Covid-19 vaccine status: Declined, Education has  been provided regarding the importance of this vaccine but patient still declined. Advised may receive this vaccine at local pharmacy or Health Dept.or vaccine clinic. Aware to provide a copy of the vaccination record if obtained from local pharmacy or Health Dept. Verbalized acceptance and understanding.  Qualifies for Shingles Vaccine? Yes   Zostavax completed No   Shingrix Completed?: No.    Education has been provided regarding the importance of this vaccine. Patient has been advised to call insurance company to determine out of pocket expense if they have not yet received this vaccine. Advised may also receive vaccine at local pharmacy or Health Dept. Verbalized acceptance and understanding.  Screening Tests Health Maintenance  Topic Date Due   Zoster Vaccines- Shingrix (1 of 2) Never done   DEXA SCAN  Never done   COVID-19 Vaccine (5 - 2024-25 season) 12/15/2022   Colonoscopy  04/04/2023   Pneumonia Vaccine 59+ Years old (1 of 1 - PCV) 12/25/2023 (Originally 12/13/2021)   Medicare Annual Wellness (AWV)  04/20/2024   MAMMOGRAM  03/15/2025   INFLUENZA VACCINE  Completed   Hepatitis C Screening  Completed   HPV VACCINES  Aged Out   DTaP/Tdap/Td  Discontinued    Health Maintenance  Health Maintenance Due  Topic Date Due   Zoster Vaccines- Shingrix (1 of 2) Never done   DEXA SCAN  Never done   COVID-19 Vaccine (5 - 2024-25 season) 12/15/2022   Colonoscopy  04/04/2023    Colorectal cancer screening: Referral to GI placed Scheduled for 05/06/23. Pt aware the office will call re: appt.  Mammogram status: Completed 03/16/23. Repeat every year  Bone Density status: Ordered Scheduled for 10/29/23. Pt provided with contact info and advised to call to schedule appt.     Additional Screening:  Hepatitis C Screening: does qualify; Completed 12/27/20  Vision Screening: Recommended annual ophthalmology exams for early detection of glaucoma and other disorders of the eye. Is the patient  up to date with their annual eye exam?  Yes  Who is the provider or what is the name of the office in which the patient attends annual eye exams? Dr Milissa If pt is not established with a provider, would they like to be referred to a provider to establish care? No .   Dental Screening: Recommended annual dental exams for proper oral hygiene   Community Resource Referral / Chronic Care Management:  CRR required this visit?  No   CCM required this visit?  No     Plan:     I have personally reviewed and noted the following in the patient's chart:   Medical and social history Use of alcohol, tobacco or illicit drugs  Current medications and supplements including opioid prescriptions. Patient is not currently taking opioid prescriptions. Functional ability and status Nutritional status Physical activity Advanced directives List of other physicians Hospitalizations, surgeries, and ER visits in previous 12 months Vitals Screenings to include cognitive, depression, and falls Referrals and appointments  In addition, I have reviewed and discussed with patient certain preventive protocols, quality metrics, and best practice recommendations. A written personalized care plan for preventive services as well as general preventive health recommendations were provided to patient.  Rojelio LELON Blush, LPN   11/15/7972   After Visit Summary: (MyChart) Due to this being a telephonic visit, the after visit summary with patients personalized plan was offered to patient via MyChart   Nurse Notes: None

## 2023-05-02 ENCOUNTER — Encounter: Payer: Self-pay | Admitting: Gastroenterology

## 2023-05-06 ENCOUNTER — Encounter: Payer: Self-pay | Admitting: Gastroenterology

## 2023-05-06 ENCOUNTER — Ambulatory Visit: Payer: Medicare HMO | Admitting: Gastroenterology

## 2023-05-06 VITALS — BP 123/70 | HR 58 | Temp 96.8°F | Resp 11 | Ht 63.0 in | Wt 193.0 lb

## 2023-05-06 DIAGNOSIS — K648 Other hemorrhoids: Secondary | ICD-10-CM | POA: Diagnosis not present

## 2023-05-06 DIAGNOSIS — Z860101 Personal history of adenomatous and serrated colon polyps: Secondary | ICD-10-CM | POA: Diagnosis not present

## 2023-05-06 DIAGNOSIS — Z1211 Encounter for screening for malignant neoplasm of colon: Secondary | ICD-10-CM | POA: Diagnosis not present

## 2023-05-06 DIAGNOSIS — I10 Essential (primary) hypertension: Secondary | ICD-10-CM | POA: Diagnosis not present

## 2023-05-06 DIAGNOSIS — K640 First degree hemorrhoids: Secondary | ICD-10-CM | POA: Diagnosis not present

## 2023-05-06 DIAGNOSIS — K5730 Diverticulosis of large intestine without perforation or abscess without bleeding: Secondary | ICD-10-CM | POA: Diagnosis not present

## 2023-05-06 MED ORDER — SODIUM CHLORIDE 0.9 % IV SOLN: 500.0000 mL | Freq: Once | INTRAVENOUS | Status: DC

## 2023-05-06 NOTE — Op Note (Signed)
[facility] Patient Name: Katie Richards Procedure Date: 05/06/2023 8:30 AM MRN: [id number] Endoscopist: Doristine Locks , MD, [id number] Age: 68 Referring MD:  Date of Birth: 22-Jun-1955 Gender: Female Account #: [id number] Procedure:                Colonoscopy Indications:              Surveillance: Personal history of adenomatous                            polyps on last colonoscopy 5 years ago                           Last colonoscopy was 03/2018 and notable for 2 mm                            ascending colon adenoma. Medicines:                Monitored Anesthesia Care Procedure:                Pre-Anesthesia Assessment:                           - Prior to the procedure, a History and Physical                            was performed, and patient medications and                            allergies were reviewed. The patient's tolerance of                            previous anesthesia was also reviewed. The risks                            and benefits of the procedure and the sedation                            options and risks were discussed with the patient.                            All questions were answered, and informed consent                            was obtained. Prior Anticoagulants: The patient has                            taken no anticoagulant or antiplatelet agents. ASA                            Grade Assessment: II - A patient with mild systemic                            disease. After reviewing the risks and benefits,  the patient was deemed in satisfactory condition to                            undergo the procedure.                           After obtaining informed consent, the colonoscope                            was passed under direct vision. Throughout the                            procedure, the patient's blood pressure, pulse, and                            oxygen saturations were monitored  continuously. The                            Olympus CF-HQ190L ([id number]) Colonoscope was                            introduced through the anus and advanced to the the                            cecum, identified by appendiceal orifice and                            ileocecal valve. The colonoscopy was performed                            without difficulty. The patient tolerated the                            procedure well. The quality of the bowel                            preparation was good. The ileocecal valve,                            appendiceal orifice, and rectum were photographed. Scope In: 8:40:11 AM Scope Out: 8:50:34 AM Scope Withdrawal Time: 0 hours 8 minutes 45 seconds  Total Procedure Duration: 0 hours 10 minutes 23 seconds  Findings:                 The perianal and digital rectal examinations were                            normal.                           A few small-mouthed diverticula were found in the                            sigmoid colon and descending colon.  The exam was otherwise normal throughout the                            remainder of the colon.                           Non-bleeding internal hemorrhoids were found during                            retroflexion. The hemorrhoids were small. Complications:            No immediate complications. Estimated Blood Loss:     Estimated blood loss: none. Impression:               - Diverticulosis in the sigmoid colon and in the                            descending colon.                           - Non-bleeding internal hemorrhoids.                           - No specimens collected. Recommendation:           - Patient has a contact number available for                            emergencies. The signs and symptoms of potential                            delayed complications were discussed with the                            patient. Return to normal activities tomorrow.                             Written discharge instructions were provided to the                            patient.                           - Resume previous diet.                           - Continue present medications.                           - Repeat colonoscopy in 7 years for surveillance.                           - Return to GI clinic PRN. Doristine Locks, MD 05/06/2023 8:55:11 AM

## 2023-05-06 NOTE — Progress Notes (Signed)
Pt's states no medical or surgical changes since previsit or office visit. 

## 2023-05-06 NOTE — Patient Instructions (Addendum)
- Resume previous diet.                           - Continue present medications.                           - Repeat colonoscopy in 7 years for surveillance.                           - Return to GI clinic PRN   YOU HAD AN ENDOSCOPIC PROCEDURE TODAY AT THE [facility]:   Refer to the procedure report that was given to you for any specific questions about what was found during the examination.  If the procedure report does not answer your questions, please call your gastroenterologist to clarify.  If you requested that your care partner not be given the details of your procedure findings, then the procedure report has been included in a sealed envelope for you to review at your convenience later.  YOU SHOULD EXPECT: Some feelings of bloating in the abdomen. Passage of more gas than usual.  Walking can help get rid of the air that was put into your GI tract during the procedure and reduce the bloating. If you had a lower endoscopy (such as a colonoscopy or flexible sigmoidoscopy) you may notice spotting of blood in your stool or on the toilet paper. If you underwent a bowel prep for your procedure, you may not have a normal bowel movement for a few days.  Please Note:  You might notice some irritation and congestion in your nose or some drainage.  This is from the oxygen used during your procedure.  There is no need for concern and it should clear up in a day or so.  SYMPTOMS TO REPORT IMMEDIATELY:  Following lower endoscopy (colonoscopy or flexible sigmoidoscopy):  Excessive amounts of blood in the stool  Significant tenderness or worsening of abdominal pains  Swelling of the abdomen that is new, acute  Fever of 100F or higher   For urgent or emergent issues, a gastroenterologist can be reached at any hour by calling [phone]. Do not use MyChart messaging for urgent concerns.    DIET:  We do recommend a small meal at first, but then you may  proceed to your regular diet.  Drink plenty of fluids but you should avoid alcoholic beverages for 24 hours.  ACTIVITY:  You should plan to take it easy for the rest of today and you should NOT DRIVE or use heavy machinery until tomorrow (because of the sedation medicines used during the test).    FOLLOW UP: Our staff will call the number listed on your records the next business day following your procedure.  We will call around 7:15- 8:00 am to check on you and address any questions or concerns that you may have regarding the information given to you following your procedure. If we do not reach you, we will leave a message.     If any biopsies were taken you will be contacted by phone or by letter within the next 1-3 weeks.  Please call us at [phone] if you have not heard about the biopsies in 3 weeks.    SIGNATURES/CONFIDENTIALITY: You and/or your care partner have signed paperwork which will be entered into your electronic medical record.  These signatures attest to the fact that that the information  above on your After Visit Summary has been reviewed and is understood.  Full responsibility of the confidentiality of this discharge information lies with you and/or your care-partner.

## 2023-05-06 NOTE — Progress Notes (Signed)
Report to PACU, RN, vss, BBS= Clear.  

## 2023-05-06 NOTE — Progress Notes (Signed)
GASTROENTEROLOGY PROCEDURE H&P NOTE   Primary Care Physician: Deeann Saint, MD    Reason for Procedure:  Colon polyp surveillance  Plan:    Colonoscopy  Patient is appropriate for endoscopic procedure(s) in the ambulatory ([facility]) setting.  The nature of the procedure, as well as the risks, benefits, and alternatives were carefully and thoroughly reviewed with the patient. Ample time for discussion and questions allowed. The patient understood, was satisfied, and agreed to proceed.     HPI: Katie Richards is a 68 y.o. female who presents for colonoscopy for ongoing colon polyp surveillance and colon cancer screening.  No active GI symptoms.    Fhx notable for brother with colon cancer vs pancreatic cancer (patient unsure).   Last colonoscopy was 03/2018 and notable for 2 mm ascending colon adenoma.   Past Medical History:  Diagnosis Date   Allergic rhinitis    Allergy    Anemia    History of   Arthritis    knees   Asthma    Childhood,    Cataract    forming    Headache    History of migraine headaches    Hypertension    SVD (spontaneous vaginal delivery)    x 3, 2 living    Past Surgical History:  Procedure Laterality Date   BILATERAL CARPAL TUNNEL RELEASE     BILATERAL SALPINGECTOMY Bilateral 06/25/2017   Procedure: BILATERAL SALPINGECTOMY;  Surgeon: Marlow Baars, MD;  Location: [facility];  Service: Gynecology;  Laterality: Bilateral;   COLONOSCOPY     CYSTOSCOPY  06/25/2017   Procedure: CYSTOSCOPY;  Surgeon: Marlow Baars, MD;  Location: [facility];  Service: Gynecology;;   DENTAL SURGERY     HYSTEROSCOPY N/A 04/21/2015   Procedure: HYSTEROSCOPY WITH POLYPECTOMY;  Surgeon: Marlow Baars, MD;  Location: [facility];  Service: Gynecology;  Laterality: N/A;  WITH MYOSURE   LAPAROSCOPIC ASSISTED VAGINAL HYSTERECTOMY N/A 06/25/2017   Procedure: LAPAROSCOPIC ASSISTED VAGINAL HYSTERECTOMY;  Surgeon: Marlow Baars, MD;  Location: [facility];  Service: Gynecology;  Laterality:  N/A;   OOPHORECTOMY Left 06/25/2017   Procedure: OOPHORECTOMY;  Surgeon: Marlow Baars, MD;  Location: [facility];  Service: Gynecology;  Laterality: Left;   reverese tubal ligation     TUBAL LIGATION      Prior to Admission medications   Medication Sig Start Date End Date Taking? Authorizing Provider  allopurinol (ZYLOPRIM) 100 MG tablet Take 1 tablet by mouth once daily 02/27/23  Yes Deeann Saint, MD  atenolol (TENORMIN) 25 MG tablet Take 1 tablet (25 mg total) by mouth daily. 12/25/22  Yes Deeann Saint, MD  Cholecalciferol (VITAMIN D) 2000 units CAPS Take 1 capsule by mouth daily.   Yes [provider]  furosemide (LASIX) 20 MG tablet Take 1 tablet (20 mg total) by mouth every morning. 03/11/23  Yes Deeann Saint, MD  potassium chloride SA (KLOR-CON M) 20 MEQ tablet Take 2 tablets (40 mEq total) by mouth daily. 03/11/23  Yes Deeann Saint, MD  Elastic Bandages & Supports (MEDICAL COMPRESSION SOCKS) MISC 1 each by Does not apply route daily. 11/12/19   Deeann Saint, MD    Current Outpatient Medications  Medication Sig Dispense Refill   allopurinol (ZYLOPRIM) 100 MG tablet Take 1 tablet by mouth once daily 90 tablet 2   atenolol (TENORMIN) 25 MG tablet Take 1 tablet (25 mg total) by mouth daily. 90 tablet 3   Cholecalciferol (VITAMIN D) 2000 units CAPS Take 1 capsule by mouth  daily.     furosemide (LASIX) 20 MG tablet Take 1 tablet (20 mg total) by mouth every morning. 90 tablet 2   potassium chloride SA (KLOR-CON M) 20 MEQ tablet Take 2 tablets (40 mEq total) by mouth daily. 180 tablet 2   Elastic Bandages & Supports (MEDICAL COMPRESSION SOCKS) MISC 1 each by Does not apply route daily. 2 each 0   Current Facility-Administered Medications  Medication Dose Route Frequency Provider Last Rate Last Admin   0.9 %  sodium chloride infusion  500 mL Intravenous Once Lesia Monica V, DO        Allergies as of 05/06/2023 - Review Complete 05/06/2023  Allergen Reaction  Noted   Sulfonamide derivatives Hives 12/25/2022    Family History  Problem Relation Age of Onset   Hypertension Mother    Stroke Mother    Prostate cancer Father    Hypertension Father    Heart disease Sister    Alcohol abuse Brother    Stroke Brother    Pancreatic cancer Brother    Colon cancer Brother    Colon polyps Brother    Lung cancer Brother    Esophageal cancer Neg Hx    Diabetes Neg Hx    Breast cancer Neg Hx    Crohn's disease Neg Hx    Rectal cancer Neg Hx    Stomach cancer Neg Hx     Social History   Socioeconomic History   Marital status: Married    Spouse name: Not on file   Number of children: 2   Years of education: Not on file   Highest education level: Not on file  Occupational History   Occupation: [employer]  Tobacco Use   Smoking status: Never   Smokeless tobacco: Never  Vaping Use   Vaping status: Never Used  Substance and Sexual Activity   Alcohol use: Yes    Alcohol/week: 2.0 standard drinks of alcohol    Types: 2 Glasses of wine per week    Comment: ocassional   Drug use: No   Sexual activity: Not on file  Other Topics Concern   Not on file  Social History Narrative   Not on file   Social Drivers of Health   Financial Resource Strain: Low Risk  (04/21/2023)   Overall Financial Resource Strain (CARDIA)    Difficulty of Paying Living Expenses: Not hard at all  Food Insecurity: No Food Insecurity (04/21/2023)   Hunger Vital Sign    Worried About Running Out of Food in the Last Year: Never true    Ran Out of Food in the Last Year: Never true  Transportation Needs: No Transportation Needs (04/21/2023)   PRAPARE - Administrator, Civil Service (Medical): No    Lack of Transportation (Non-Medical): No  Physical Activity: Sufficiently Active (04/21/2023)   Exercise Vital Sign    Days of Exercise per Week: 4 days    Minutes of Exercise per Session: 90 min  Stress: No Stress Concern Present (04/21/2023)   [employer]  of Occupational Health - Occupational Stress Questionnaire    Feeling of Stress : Not at all  Social Connections: Socially Integrated (04/21/2023)   Social Connection and Isolation Panel [NHANES]    Frequency of Communication with Friends and Family: More than three times a week    Frequency of Social Gatherings with Friends and Family: More than three times a week    Attends Religious Services: More than 4 times per year  Active Member of Clubs or Organizations: Yes    Attends Banker Meetings: More than 4 times per year    Marital Status: Married  Catering manager Violence: Not At Risk (04/21/2023)   Humiliation, Afraid, Rape, and Kick questionnaire    Fear of Current or Ex-Partner: No    Emotionally Abused: No    Physically Abused: No    Sexually Abused: No    Physical Exam: Vital signs in last 24 hours: @BP  131/76   Pulse (!) 55   Temp (!) 96.8 F (36 C)   Resp 12   Ht 5\' 3"  (1.6 m)   Wt 193 lb (87.5 kg)   SpO2 100%   BMI 34.19 kg/m  GEN: NAD EYE: Sclerae anicteric ENT: MMM CV: Non-tachycardic Pulm: CTA b/l GI: Soft, NT/ND NEURO:  Alert & Oriented x 3   Doristine Locks, DO [facility]   05/06/2023 8:34 AM

## 2023-05-07 ENCOUNTER — Telehealth: Payer: Self-pay

## 2023-05-07 NOTE — Telephone Encounter (Signed)
  Follow up Call-     05/06/2023    7:45 AM  Call back number  Post procedure Call Back phone  # [phone]  Permission to leave phone message Yes     Patient questions:  Do you have a fever, pain , or abdominal swelling? No. Pain Score  0 *  Have you tolerated food without any problems? Yes.    Have you been able to return to your normal activities? Yes.    Do you have any questions about your discharge instructions: Diet   No. Medications  No. Follow up visit  No.  Do you have questions or concerns about your Care? No.  Actions: * If pain score is 4 or above: No action needed, pain <4.

## 2023-09-16 DIAGNOSIS — Z01419 Encounter for gynecological examination (general) (routine) without abnormal findings: Secondary | ICD-10-CM | POA: Diagnosis not present

## 2023-09-16 DIAGNOSIS — Z1231 Encounter for screening mammogram for malignant neoplasm of breast: Secondary | ICD-10-CM | POA: Diagnosis not present

## 2023-10-29 ENCOUNTER — Ambulatory Visit (HOSPITAL_BASED_OUTPATIENT_CLINIC_OR_DEPARTMENT_OTHER)
Admission: RE | Admit: 2023-10-29 | Discharge: 2023-10-29 | Disposition: A | Discharge status: Home / Self Care | Source: Ambulatory Visit | Attending: Family Medicine | Admitting: Family Medicine

## 2023-10-29 ENCOUNTER — Other Ambulatory Visit: Payer: Medicare HMO

## 2023-10-29 DIAGNOSIS — Z780 Asymptomatic menopausal state: Secondary | ICD-10-CM | POA: Diagnosis not present

## 2023-10-30 ENCOUNTER — Ambulatory Visit: Payer: Self-pay | Admitting: Family Medicine

## 2023-12-16 ENCOUNTER — Other Ambulatory Visit: Payer: Self-pay | Admitting: Family Medicine

## 2023-12-16 DIAGNOSIS — M109 Gout, unspecified: Secondary | ICD-10-CM

## 2023-12-18 DIAGNOSIS — H5211 Myopia, right eye: Secondary | ICD-10-CM | POA: Diagnosis not present

## 2023-12-26 ENCOUNTER — Encounter: Payer: Self-pay | Admitting: Family Medicine

## 2023-12-26 ENCOUNTER — Ambulatory Visit: Admitting: Family Medicine

## 2023-12-26 VITALS — BP 134/72 | HR 76 | Temp 97.9°F | Ht 63.0 in | Wt 194.6 lb

## 2023-12-26 DIAGNOSIS — Z0000 Encounter for general adult medical examination without abnormal findings: Secondary | ICD-10-CM | POA: Diagnosis not present

## 2023-12-26 DIAGNOSIS — I10 Essential (primary) hypertension: Secondary | ICD-10-CM

## 2023-12-26 DIAGNOSIS — M109 Gout, unspecified: Secondary | ICD-10-CM | POA: Diagnosis not present

## 2023-12-26 DIAGNOSIS — Z23 Encounter for immunization: Secondary | ICD-10-CM | POA: Diagnosis not present

## 2023-12-26 LAB — T4, FREE: Free T4: 0.81 ng/dL (ref 0.60–1.60)

## 2023-12-26 LAB — COMPREHENSIVE METABOLIC PANEL WITH GFR
ALT: 17 U/L (ref 0–35)
AST: 20 U/L (ref 0–37)
Albumin: 4.3 g/dL (ref 3.5–5.2)
Alkaline Phosphatase: 52 U/L (ref 39–117)
BUN: 23 mg/dL (ref 6–23)
CO2: 27 meq/L (ref 19–32)
Calcium: 9.5 mg/dL (ref 8.4–10.5)
Chloride: 103 meq/L (ref 96–112)
Creatinine, Ser: 0.96 mg/dL (ref 0.40–1.20)
GFR: 61.43 mL/min (ref >60.00)
Glucose, Bld: 91 mg/dL (ref 70–99)
Potassium: 3.9 meq/L (ref 3.5–5.1)
Sodium: 141 meq/L (ref 135–145)
Total Bilirubin: 0.5 mg/dL (ref 0.2–1.2)
Total Protein: 7.4 g/dL (ref 6.0–8.3)

## 2023-12-26 LAB — LIPID PANEL
Cholesterol: 187 mg/dL (ref 0–200)
HDL: 82.5 mg/dL (ref >39.00)
LDL Cholesterol: 91 mg/dL (ref 0–99)
NonHDL: 104.97
Total CHOL/HDL Ratio: 2
Triglycerides: 70 mg/dL (ref 0.0–149.0)
VLDL: 14 mg/dL (ref 0.0–40.0)

## 2023-12-26 LAB — CBC WITH DIFFERENTIAL/PLATELET
Basophils Absolute: 0 K/uL (ref 0.0–0.1)
Basophils Relative: 0.6 % (ref 0.0–3.0)
Eosinophils Absolute: 0.1 K/uL (ref 0.0–0.7)
Eosinophils Relative: 2.2 % (ref 0.0–5.0)
HCT: 39.2 % (ref 36.0–46.0)
Hemoglobin: 12.6 g/dL (ref 12.0–15.0)
Lymphocytes Relative: 39.7 % (ref 12.0–46.0)
Lymphs Abs: 1.3 K/uL (ref 0.7–4.0)
MCHC: 32.3 g/dL (ref 30.0–36.0)
MCV: 87.3 fl (ref 78.0–100.0)
Monocytes Absolute: 0.3 K/uL (ref 0.1–1.0)
Monocytes Relative: 8.4 % (ref 3.0–12.0)
Neutro Abs: 1.6 K/uL (ref 1.4–7.7)
Neutrophils Relative %: 49.1 % (ref 43.0–77.0)
Platelets: 163 K/uL (ref 150.0–400.0)
RBC: 4.49 Mil/uL (ref 3.87–5.11)
RDW: 14.6 % (ref 11.5–15.5)
WBC: 3.3 K/uL — ABNORMAL LOW (ref 4.0–10.5)

## 2023-12-26 LAB — TSH: TSH: 4.52 u[IU]/mL (ref 0.35–5.50)

## 2023-12-26 LAB — HEMOGLOBIN A1C: Hgb A1c MFr Bld: 5.9 % (ref 4.6–6.5)

## 2023-12-26 MED ORDER — ALLOPURINOL 100 MG PO TABS
100.0000 mg | ORAL_TABLET | Freq: Every day | ORAL | 3 refills | Status: AC
Start: 2023-12-26 — End: ?

## 2023-12-26 MED ORDER — COVID-19 MRNA VAC-TRIS(PFIZER) 30 MCG/0.3ML IM SUSY: 0.3000 mL | PREFILLED_SYRINGE | Freq: Once | INTRAMUSCULAR | 0 refills | Status: AC

## 2023-12-26 NOTE — Progress Notes (Signed)
 Established Patient Office Visit   Subjective  Patient ID: Katie Richards, female    DOB: 08/10/1955  Age: 68 y.o. MRN: [id number]  Chief Complaint  Patient presents with   Annual Exam    Patient is a 68 year old female seen for CPE.  Patient states she is doing well overall.  Spending time with grandkids.  Patient requesting refill on allopurinol .  Notes improvement in knee pain since taking.  Previously required steroid injections in May.  Also noted decrease symptoms while on vacation.  Feels like she was resting more and drinking more water.  Patient had recent pneumonia and shingles vaccines at pharmacy.  Interested in influenza and COVID vaccines.    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/12/2015   ALLERGIC RHINITIS 07/20/2008   Venous (peripheral) insufficiency 02/22/2008   NEOPLASM, SKIN 02/17/2007   Hypertensive disorder 11/04/2006   Past Medical History:  Diagnosis Date   Allergic rhinitis    Allergy    Anemia    History of   Arthritis    knees   Asthma    Childhood,    Cataract    forming    Headache    History of migraine headaches    Hypertension    SVD (spontaneous vaginal delivery)    x 3, 2 living   Past Surgical History:  Procedure Laterality Date   BILATERAL CARPAL TUNNEL RELEASE     BILATERAL SALPINGECTOMY Bilateral 06/25/2017   Procedure: BILATERAL SALPINGECTOMY;  Surgeon: Gretta Gums, MD;  Location: [facility];  Service: Gynecology;  Laterality: Bilateral;   COLONOSCOPY     CYSTOSCOPY  06/25/2017   Procedure: CYSTOSCOPY;  Surgeon: Gretta Gums, MD;  Location: [facility];  Service: Gynecology;;   DENTAL SURGERY     HYSTEROSCOPY N/A 04/21/2015   Procedure: HYSTEROSCOPY WITH POLYPECTOMY;   Surgeon: Gums Gretta, MD;  Location: [facility];  Service: Gynecology;  Laterality: N/A;  WITH MYOSURE   LAPAROSCOPIC ASSISTED VAGINAL HYSTERECTOMY N/A 06/25/2017   Procedure: LAPAROSCOPIC ASSISTED VAGINAL HYSTERECTOMY;  Surgeon: Gretta Gums, MD;  Location: [facility];  Service: Gynecology;  Laterality: N/A;   OOPHORECTOMY Left 06/25/2017   Procedure: OOPHORECTOMY;  Surgeon: Gretta Gums, MD;  Location: [facility];  Service: Gynecology;  Laterality: Left;   reverese tubal ligation     TUBAL LIGATION     Social History   Tobacco Use   Smoking status: Never   Smokeless tobacco: Never  Vaping Use   Vaping status: Never Used  Substance Use Topics   Alcohol use: Yes    Alcohol/week: 2.0 standard drinks of alcohol    Types: 2 Glasses of wine per week    Comment: ocassional   Drug use: No   Family History  Problem Relation Age of Onset   Hypertension Mother    Stroke Mother    Prostate cancer Father    Hypertension Father    Heart disease Sister    Alcohol abuse Brother    Stroke Brother    Pancreatic cancer Brother    Colon cancer Brother    Colon polyps Brother    Lung cancer Brother    Esophageal cancer Neg Hx    Diabetes Neg Hx    Breast cancer Neg Hx    Crohn's disease  Neg Hx    Rectal cancer Neg Hx    Stomach cancer Neg Hx    Allergies  Allergen Reactions   Sulfonamide Derivatives Hives    ROS Negative unless stated above    Objective:     BP 134/72 (BP Location: Left Arm, Patient Position: Sitting, Cuff Size: Large)   Pulse 76   Temp 97.9 F (36.6 C) (Oral)   Ht 5' 3 (1.6 m)   Wt 194 lb 9.6 oz (88.3 kg)   SpO2 99%   BMI 34.47 kg/m  BP Readings from Last 3 Encounters:  12/26/23 134/72  05/06/23 123/70  12/25/22 136/86   Wt Readings from Last 3 Encounters:  12/26/23 194 lb 9.6 oz (88.3 kg)  05/06/23 193 lb (87.5 kg)  04/21/23 190 lb (86.2 kg)      Physical Exam Constitutional:      Appearance: Normal appearance.  HENT:     Head: Normocephalic and  atraumatic.     Right Ear: Tympanic membrane, ear canal and external ear normal.     Left Ear: Tympanic membrane, ear canal and external ear normal.     Nose: Nose normal.     Mouth/Throat:     Mouth: Mucous membranes are moist.     Pharynx: No oropharyngeal exudate or posterior oropharyngeal erythema.  Eyes:     General: No scleral icterus.    Extraocular Movements: Extraocular movements intact.     Conjunctiva/sclera: Conjunctivae normal.     Pupils: Pupils are equal, round, and reactive to light.  Neck:     Thyroid : No thyromegaly.     Vascular: No carotid bruit.  Cardiovascular:     Rate and Rhythm: Normal rate and regular rhythm.     Pulses: Normal pulses.     Heart sounds: Normal heart sounds. No murmur heard.    No friction rub.  Pulmonary:     Effort: Pulmonary effort is normal.     Breath sounds: Normal breath sounds. No wheezing, rhonchi or rales.  Abdominal:     General: Bowel sounds are normal.     Palpations: Abdomen is soft.     Tenderness: There is no abdominal tenderness.  Musculoskeletal:        General: No deformity. Normal range of motion.  Lymphadenopathy:     Cervical: No cervical adenopathy.  Skin:    General: Skin is warm and dry.     Findings: No lesion.  Neurological:     General: No focal deficit present.     Mental Status: She is alert and oriented to person, place, and time.  Psychiatric:        Mood and Affect: Mood normal.        Thought Content: Thought content normal.        12/26/2023    8:41 AM 04/21/2023    3:54 PM 11/06/2021    8:58 AM  Depression screen PHQ 2/9  Decreased Interest 0 0 0  Down, Depressed, Hopeless 0 0 0  PHQ - 2 Score 0 0 0  Altered sleeping 0    Tired, decreased energy 1    Change in appetite 0    Feeling bad or failure about yourself  0    Trouble concentrating 0    Moving slowly or fidgety/restless 0    Suicidal thoughts 0    PHQ-9 Score 1    Difficult doing work/chores Not difficult at all         12/26/2023    8:41 AM  11/24/2020    9:09 AM  GAD 7 : Generalized Anxiety Score  Nervous, Anxious, on Edge 0 0  Control/stop worrying 0 0  Worry too much - different things 0 0  Trouble relaxing 0 0  Restless 0 0  Easily annoyed or irritable 0 0  Afraid - awful might happen 0 0  Total GAD 7 Score 0 0     No results found for any visits on 12/26/23.    Assessment & Plan:   Well adult exam -     CBC with Differential/Platelet; Future -     Comprehensive metabolic panel with GFR; Future -     Hemoglobin A1c; Future -     Lipid panel; Future -     T4, free; Future -     TSH; Future  Essential hypertension -     Comprehensive metabolic panel with GFR; Future -     T4, free; Future  Need for influenza vaccination -     Flu vaccine HIGH DOSE PF(Fluzone Trivalent)  Gout, unspecified cause, unspecified chronicity, unspecified site -     Allopurinol ; Take 1 tablet (100 mg total) by mouth daily.  Dispense: 90 tablet; Refill: 3  Need for COVID-19 vaccine -     COVID-19 mRNA Vac-TriS(Pfizer); Inject 0.3 mLs into the muscle once for 1 dose.  Dispense: 0.3 mL; Refill: 0  Age-appropriate health screenings discussed.  Obtain labs.  Immunizations reviewed.  Influenza vaccine given this visit.  Rx for COVID-vaccine given to receive at pharmacy.  Will update system with pneumonia and shingles vaccine when information available.  Mammogram done 03/16/2023.  Colonoscopy done 05/06/2023.  Hysterectomy status.  Yearly well woman exams with OB/Gyn.  Lifestyle modifications including increase water intake, avoid foods known to cause gout sx.    Return in about 1 year (around 12/25/2024) for physical.  6 months for chronic conditions, sooner if needed.   Clotilda JONELLE Single, MD

## 2023-12-31 ENCOUNTER — Ambulatory Visit: Payer: Self-pay | Admitting: Family Medicine

## 2024-01-12 ENCOUNTER — Other Ambulatory Visit: Payer: Self-pay | Admitting: Family Medicine

## 2024-01-12 DIAGNOSIS — I10 Essential (primary) hypertension: Secondary | ICD-10-CM

## 2024-01-19 ENCOUNTER — Other Ambulatory Visit: Payer: Self-pay | Admitting: Family Medicine

## 2024-01-19 DIAGNOSIS — I10 Essential (primary) hypertension: Secondary | ICD-10-CM

## 2024-01-19 MED ORDER — POTASSIUM CHLORIDE CRYS ER 20 MEQ PO TBCR: 40.0000 meq | EXTENDED_RELEASE_TABLET | Freq: Every day | ORAL | 3 refills | Status: AC

## 2024-01-19 NOTE — Telephone Encounter (Signed)
 Copied from CRM [id number]. Topic: Clinical - Medication Refill >> Jan 19, 2024  8:53 AM Donna BRAVO wrote: Medication: potassium chloride  SA (KLOR-CON  M) 20 MEQ tablet  Has the patient contacted their pharmacy? Yes Pharmacy has sent in refill request 4 times  This is the patient's preferred pharmacy:   [facility] [address], [place] - [address] [place] [address] Phone: [phone] Fax: [phone]  Is this the correct pharmacy for this prescription? Yes If no, delete pharmacy and type the correct one.   Has the prescription been filled recently? Yes patient states is was May 2025  Is the patient out of the medication? Yes  Has the patient been seen for an appointment in the last year OR does the patient have an upcoming appointment? Yes  Can we respond through MyChart? Yes  Agent: Please be advised that Rx refills may take up to 3 business days. We ask that you follow-up with your pharmacy.

## 2024-04-23 ENCOUNTER — Ambulatory Visit: Payer: Medicare HMO

## 2024-04-23 VITALS — BP 120/60 | HR 69 | Temp 98.1°F | Ht 63.0 in | Wt 195.0 lb

## 2024-04-23 DIAGNOSIS — Z0000 Encounter for general adult medical examination without abnormal findings: Secondary | ICD-10-CM

## 2024-04-23 NOTE — Patient Instructions (Addendum)
 Ms. Greenley,  Thank you for taking the time for your Medicare Wellness Visit. I appreciate your continued commitment to your health goals. Please review the care plan we discussed, and feel free to reach out if I can assist you further.  Please note that Annual Wellness Visits do not include a physical exam. Some assessments may be limited, especially if the visit was conducted virtually. If needed, we may recommend an in-person follow-up with your provider.  Ongoing Care Seeing your primary care provider every 3 to 6 months helps us  monitor your health and provide consistent, personalized care.   Referrals If a referral was made during today's visit and you haven't received any updates within two weeks, please contact the referred provider directly to check on the status.  Recommended Screenings:  Health Maintenance  Topic Date Due   COVID-19 Vaccine (8 - 2025-26 season) 12/15/2023   Breast Cancer Screening  03/15/2025   Medicare Annual Wellness Visit  04/23/2025   Colon Cancer Screening  05/05/2030   Pneumococcal Vaccine for age over 45  Completed   Flu Shot  Completed   Osteoporosis screening with Bone Density Scan  Completed   Hepatitis C Screening  Completed   Zoster (Shingles) Vaccine  Completed   Meningitis B Vaccine  Aged Out   DTaP/Tdap/Td vaccine  Discontinued       04/23/2024    3:39 PM  Advanced Directives  Does Patient Have a Medical Advance Directive? No  Would patient like information on creating a medical advance directive? No - Patient declined    Vision: Annual vision screenings are recommended for early detection of glaucoma, cataracts, and diabetic retinopathy. These exams can also reveal signs of chronic conditions such as diabetes and high blood pressure.  Dental: Annual dental screenings help detect early signs of oral cancer, gum disease, and other conditions linked to overall health, including heart disease and diabetes.  Please see the attached  documents for additional preventive care recommendations.

## 2024-04-23 NOTE — Progress Notes (Signed)
 "  Chief Complaint  Patient presents with   Medicare Wellness     Subjective:   Katie Richards is a 69 y.o. female who presents for a Medicare Annual Wellness Visit.  Visit info / Clinical Intake: Medicare Wellness Visit Type:: Subsequent Annual Wellness Visit Persons participating in visit and providing information:: patient Medicare Wellness Visit Mode:: In-person (required for WTM) Interpreter Needed?: No Pre-visit prep was completed: yes AWV questionnaire completed by patient prior to visit?: yes Date:: 04/22/24 Living arrangements:: lives with spouse/significant other Patient's Overall Health Status Rating: good Typical amount of pain: some Does pain affect daily life?: no Are you currently prescribed opioids?: no  Dietary Habits and Nutritional Risks How many meals a day?: 2 Eats fruit and vegetables daily?: yes Most meals are obtained by: preparing own meals In the last 2 weeks, have you had any of the following?: none Diabetic:: no  Functional Status Activities of Daily Living (to include ambulation/medication): Independent Ambulation: Independent with device- listed below Home Assistive Devices/Equipment: Eyeglasses; Dentures (specify type) (Dental Implants) Medication Administration: Independent Home Management (perform basic housework or laundry): Independent Manage your own finances?: yes Primary transportation is: driving Concerns about vision?: no *vision screening is required for WTM* Concerns about hearing?: no  Fall Screening Falls in the past year?: 0 Number of falls in past year: 0 Was there an injury with Fall?: 0 Fall Risk Category Calculator: 0 Patient Fall Risk Level: Low Fall Risk  Fall Risk Patient at Risk for Falls Due to: No Fall Risks Fall risk Follow up: Falls evaluation completed  Home and Transportation Safety: All rugs have non-skid backing?: yes All stairs or steps have railings?: yes Grab bars in the bathtub or shower?: (!)  no Have non-skid surface in bathtub or shower?: yes Good home lighting?: yes Regular seat belt use?: yes Hospital stays in the last year:: no  Cognitive Assessment Difficulty concentrating, remembering, or making decisions? : no Will 6CIT or Mini Cog be Completed: yes What year is it?: 0 points What month is it?: 0 points Give patient an address phrase to remember (5 components): [address] [place]  About what time is it?: 0 points Count backwards from 20 to 1: 0 points Say the months of the year in reverse: 0 points Repeat the address phrase from earlier: 0 points 6 CIT Score: 0 points  Advance Directives (For Healthcare) Does Patient Have a Medical Advance Directive?: No Would patient like information on creating a medical advance directive?: No - Patient declined  Reviewed/Updated  Reviewed/Updated: Reviewed All (Medical, Surgical, Family, Medications, Allergies, Care Teams, Patient Goals)    Allergies (verified) Sulfonamide derivatives   Current Medications (verified) Outpatient Encounter Medications as of 04/23/2024  Medication Sig   allopurinol  (ZYLOPRIM ) 100 MG tablet Take 1 tablet (100 mg total) by mouth daily.   atenolol  (TENORMIN ) 25 MG tablet Take 1 tablet by mouth once daily   Cholecalciferol (VITAMIN D ) 2000 units CAPS Take 1 capsule by mouth daily.   Elastic Bandages & Supports (MEDICAL COMPRESSION SOCKS) MISC 1 each by Does not apply route daily.   furosemide  (LASIX ) 20 MG tablet TAKE 1 TABLET BY MOUTH ONCE DAILY IN THE MORNING   potassium chloride  SA (KLOR-CON  M) 20 MEQ tablet Take 2 tablets (40 mEq total) by mouth daily.   No facility-administered encounter medications on file as of 04/23/2024.    History: Past Medical History:  Diagnosis Date   Allergic rhinitis    Allergy    Anemia  History of   Arthritis    knees   Asthma    Childhood,    Cataract    forming    Headache    History of migraine headaches    Hypertension    SVD  (spontaneous vaginal delivery)    x 3, 2 living   Past Surgical History:  Procedure Laterality Date   BILATERAL CARPAL TUNNEL RELEASE     BILATERAL SALPINGECTOMY Bilateral 06/25/2017   Procedure: BILATERAL SALPINGECTOMY;  Surgeon: Gretta Gums, MD;  Location: [facility];  Service: Gynecology;  Laterality: Bilateral;   COLONOSCOPY     CYSTOSCOPY  06/25/2017   Procedure: CYSTOSCOPY;  Surgeon: Gretta Gums, MD;  Location: [facility];  Service: Gynecology;;   DENTAL SURGERY     HYSTEROSCOPY N/A 04/21/2015   Procedure: HYSTEROSCOPY WITH POLYPECTOMY;  Surgeon: Gums Gretta, MD;  Location: [facility];  Service: Gynecology;  Laterality: N/A;  WITH MYOSURE   LAPAROSCOPIC ASSISTED VAGINAL HYSTERECTOMY N/A 06/25/2017   Procedure: LAPAROSCOPIC ASSISTED VAGINAL HYSTERECTOMY;  Surgeon: Gretta Gums, MD;  Location: [facility];  Service: Gynecology;  Laterality: N/A;   OOPHORECTOMY Left 06/25/2017   Procedure: OOPHORECTOMY;  Surgeon: Gretta Gums, MD;  Location: [facility];  Service: Gynecology;  Laterality: Left;   reverese tubal ligation     TUBAL LIGATION     Family History  Problem Relation Age of Onset   Hypertension Mother    Stroke Mother    Prostate cancer Father    Hypertension Father    Heart disease Sister    Alcohol abuse Brother    Stroke Brother    Pancreatic cancer Brother    Colon cancer Brother    Colon polyps Brother    Lung cancer Brother    Esophageal cancer Neg Hx    Diabetes Neg Hx    Breast cancer Neg Hx    Crohn's disease Neg Hx    Rectal cancer Neg Hx    Stomach cancer Neg Hx    Social History   Occupational History   Occupation: [employer]  Tobacco Use   Smoking status: Never   Smokeless tobacco: Never  Vaping Use   Vaping status: Never Used  Substance and Sexual Activity   Alcohol use: Yes    Alcohol/week: 2.0 standard drinks of alcohol    Types: 2 Glasses of wine per week    Comment: ocassional   Drug use: No   Sexual activity: Not on file   Tobacco  Counseling Counseling given: No  SDOH Screenings   Food Insecurity: No Food Insecurity (04/23/2024)  Housing: Low Risk (04/23/2024)  Transportation Needs: No Transportation Needs (04/22/2024)  Utilities: Not At Risk (04/21/2023)  Alcohol Screen: Low Risk (04/22/2024)  Depression (PHQ2-9): Low Risk (04/23/2024)  Financial Resource Strain: Low Risk (04/22/2024)  Physical Activity: Sufficiently Active (04/23/2024)  Social Connections: Socially Integrated (04/23/2024)  Stress: No Stress Concern Present (04/23/2024)  Tobacco Use: Low Risk (04/23/2024)  Health Literacy: Adequate Health Literacy (04/23/2024)   See flowsheets for full screening details  Depression Screen PHQ 2 & 9 Depression Scale- Over the past 2 weeks, how often have you been bothered by any of the following problems? Little interest or pleasure in doing things: 0 Feeling down, depressed, or hopeless (PHQ Adolescent also includes...irritable): 0 PHQ-2 Total Score: 0 Trouble falling or staying asleep, or sleeping too much: 0 Feeling tired or having little energy: 1 Poor appetite or overeating (PHQ Adolescent also includes...weight loss): 0 Feeling bad about yourself - or that you are  a failure or have let yourself or your family down: 0 Trouble concentrating on things, such as reading the newspaper or watching television (PHQ Adolescent also includes...like school work): 0 Moving or speaking so slowly that other people could have noticed. Or the opposite - being so fidgety or restless that you have been moving around a lot more than usual: 0 Thoughts that you would be better off dead, or of hurting yourself in some way: 0 PHQ-9 Total Score: 1 If you checked off any problems, how difficult have these problems made it for you to do your work, take care of things at home, or get along with other people?: Not difficult at all     Goals Addressed               This Visit's Progress     Continue physical activity (pt-stated)        Remain  active!             Objective:    Today's Vitals   04/23/24 1530  BP: 120/60  Pulse: 69  Temp: 98.1 F (36.7 C)  TempSrc: Oral  SpO2: 99%  Weight: 195 lb (88.5 kg)  Height: 5' 3 (1.6 m)   Body mass index is 34.54 kg/m.  Hearing/Vision screen Hearing Screening - Comments:: Denies hearing difficulties   Vision Screening - Comments:: Wears rx glasses - up to date with routine eye exams with  Dr Debarah Immunizations and Health Maintenance Health Maintenance  Topic Date Due   COVID-19 Vaccine (8 - 2025-26 season) 12/15/2023   Mammogram  03/15/2025   Medicare Annual Wellness (AWV)  04/23/2025   Colonoscopy  05/05/2030   Pneumococcal Vaccine: 50+ Years  Completed   Influenza Vaccine  Completed   Bone Density Scan  Completed   Hepatitis C Screening  Completed   Zoster Vaccines- Shingrix  Completed   Meningococcal B Vaccine  Aged Out   DTaP/Tdap/Td  Discontinued        Assessment/Plan:  This is a routine wellness examination for Katie Richards.  Patient Care Team: Mercer Clotilda SAUNDERS, MD as PCP - General (Family Medicine)  I have personally reviewed and noted the following in the patients chart:   Medical and social history Use of alcohol, tobacco or illicit drugs  Current medications and supplements including opioid prescriptions. Functional ability and status Nutritional status Physical activity Advanced directives List of other physicians Hospitalizations, surgeries, and ER visits in previous 12 months Vitals Screenings to include cognitive, depression, and falls Referrals and appointments  No orders of the defined types were placed in this encounter.  In addition, I have reviewed and discussed with patient certain preventive protocols, quality metrics, and best practice recommendations. A written personalized care plan for preventive services as well as general preventive health recommendations were provided to patient.   Rojelio LELON Blush, LPN   8/0/7973    Return in 1 year (on 04/28/2025).  After Visit Summary: (In Person-Declined) Patient declined AVS at this time.  Nurse Notes: No voiced or noted concerns at this time "

## 2025-04-29 ENCOUNTER — Ambulatory Visit
